# Patient Record
Sex: Female | Born: 1950 | Race: Black or African American | Hispanic: No | Marital: Single | Smoking: Never smoker
Health system: Southern US, Community
[De-identification: ages and names within clinical notes are randomized; demographics above are authoritative.]

## PROBLEM LIST (undated history)

## (undated) DIAGNOSIS — T7840XA Allergy, unspecified, initial encounter: Secondary | ICD-10-CM

## (undated) DIAGNOSIS — G4730 Sleep apnea, unspecified: Secondary | ICD-10-CM

## (undated) DIAGNOSIS — I10 Essential (primary) hypertension: Secondary | ICD-10-CM

## (undated) DIAGNOSIS — M1990 Unspecified osteoarthritis, unspecified site: Secondary | ICD-10-CM

## (undated) DIAGNOSIS — E079 Disorder of thyroid, unspecified: Secondary | ICD-10-CM

## (undated) HISTORY — DX: Unspecified osteoarthritis, unspecified site: M19.90

## (undated) HISTORY — DX: Sleep apnea, unspecified: G47.30

## (undated) HISTORY — DX: Essential (primary) hypertension: I10

## (undated) HISTORY — DX: Allergy, unspecified, initial encounter: T78.40XA

## (undated) HISTORY — PX: COLONOSCOPY: SHX174

## (undated) HISTORY — PX: ABDOMINAL HYSTERECTOMY: SHX81

## (undated) HISTORY — DX: Disorder of thyroid, unspecified: E07.9

## (undated) HISTORY — PX: THYROIDECTOMY: SHX17

---

## 2004-08-30 ENCOUNTER — Emergency Department (HOSPITAL_COMMUNITY): Admission: EM | Admit: 2004-08-30 | Discharge: 2004-08-30 | Payer: Self-pay | Admitting: Family Medicine

## 2005-08-15 ENCOUNTER — Ambulatory Visit: Payer: Self-pay | Admitting: Internal Medicine

## 2005-08-26 ENCOUNTER — Ambulatory Visit: Payer: Self-pay | Admitting: Internal Medicine

## 2006-04-28 ENCOUNTER — Encounter: Admission: RE | Admit: 2006-04-28 | Discharge: 2006-04-28 | Payer: Self-pay | Admitting: Internal Medicine

## 2008-11-28 ENCOUNTER — Ambulatory Visit: Admission: RE | Admit: 2008-11-28 | Discharge: 2008-11-28 | Payer: Self-pay | Admitting: Internal Medicine

## 2008-11-28 ENCOUNTER — Ambulatory Visit: Payer: Self-pay | Admitting: Vascular Surgery

## 2008-11-28 ENCOUNTER — Encounter (INDEPENDENT_AMBULATORY_CARE_PROVIDER_SITE_OTHER): Payer: Self-pay | Admitting: Internal Medicine

## 2009-09-04 ENCOUNTER — Encounter: Admission: RE | Admit: 2009-09-04 | Discharge: 2009-09-04 | Payer: Self-pay | Admitting: *Deleted

## 2010-07-08 ENCOUNTER — Encounter
Admission: RE | Admit: 2010-07-08 | Discharge: 2010-07-13 | Payer: Self-pay | Source: Home / Self Care | Attending: Internal Medicine | Admitting: Internal Medicine

## 2010-07-14 ENCOUNTER — Ambulatory Visit: Payer: BC Managed Care – PPO | Attending: Internal Medicine

## 2010-07-14 DIAGNOSIS — M25569 Pain in unspecified knee: Secondary | ICD-10-CM | POA: Insufficient documentation

## 2010-07-14 DIAGNOSIS — IMO0001 Reserved for inherently not codable concepts without codable children: Secondary | ICD-10-CM | POA: Insufficient documentation

## 2010-07-14 DIAGNOSIS — M542 Cervicalgia: Secondary | ICD-10-CM | POA: Insufficient documentation

## 2010-07-14 DIAGNOSIS — R293 Abnormal posture: Secondary | ICD-10-CM | POA: Insufficient documentation

## 2010-07-14 DIAGNOSIS — M2560 Stiffness of unspecified joint, not elsewhere classified: Secondary | ICD-10-CM | POA: Insufficient documentation

## 2010-07-16 ENCOUNTER — Ambulatory Visit: Payer: BC Managed Care – PPO

## 2010-07-20 ENCOUNTER — Ambulatory Visit: Payer: BC Managed Care – PPO

## 2010-07-22 ENCOUNTER — Ambulatory Visit: Payer: BC Managed Care – PPO

## 2010-10-22 ENCOUNTER — Other Ambulatory Visit: Payer: Self-pay | Admitting: Family Medicine

## 2010-10-22 DIAGNOSIS — Z1231 Encounter for screening mammogram for malignant neoplasm of breast: Secondary | ICD-10-CM

## 2010-10-29 ENCOUNTER — Ambulatory Visit
Admission: RE | Admit: 2010-10-29 | Discharge: 2010-10-29 | Disposition: A | Discharge status: Home / Self Care | Payer: BC Managed Care – PPO | Source: Ambulatory Visit | Attending: Family Medicine | Admitting: Family Medicine

## 2010-10-29 DIAGNOSIS — Z1231 Encounter for screening mammogram for malignant neoplasm of breast: Secondary | ICD-10-CM

## 2011-10-10 ENCOUNTER — Other Ambulatory Visit: Payer: Self-pay | Admitting: Family Medicine

## 2011-10-10 DIAGNOSIS — Z1231 Encounter for screening mammogram for malignant neoplasm of breast: Secondary | ICD-10-CM

## 2011-10-31 ENCOUNTER — Ambulatory Visit
Admission: RE | Admit: 2011-10-31 | Discharge: 2011-10-31 | Disposition: A | Discharge status: Home / Self Care | Payer: BC Managed Care – PPO | Source: Ambulatory Visit | Attending: Family Medicine | Admitting: Family Medicine

## 2011-10-31 DIAGNOSIS — Z1231 Encounter for screening mammogram for malignant neoplasm of breast: Secondary | ICD-10-CM

## 2012-01-20 ENCOUNTER — Other Ambulatory Visit: Payer: Self-pay | Admitting: Family Medicine

## 2012-01-20 DIAGNOSIS — E042 Nontoxic multinodular goiter: Secondary | ICD-10-CM

## 2012-01-30 ENCOUNTER — Other Ambulatory Visit: Payer: BC Managed Care – PPO

## 2012-02-07 ENCOUNTER — Ambulatory Visit
Admission: RE | Admit: 2012-02-07 | Discharge: 2012-02-07 | Disposition: A | Discharge status: Home / Self Care | Payer: BC Managed Care – PPO | Source: Ambulatory Visit | Attending: Family Medicine | Admitting: Family Medicine

## 2012-02-07 DIAGNOSIS — E042 Nontoxic multinodular goiter: Secondary | ICD-10-CM

## 2012-02-14 ENCOUNTER — Other Ambulatory Visit: Payer: Self-pay | Admitting: Otolaryngology

## 2012-02-14 DIAGNOSIS — E041 Nontoxic single thyroid nodule: Secondary | ICD-10-CM

## 2012-02-15 ENCOUNTER — Ambulatory Visit
Admission: RE | Admit: 2012-02-15 | Discharge: 2012-02-15 | Disposition: A | Discharge status: Home / Self Care | Payer: BC Managed Care – PPO | Source: Ambulatory Visit | Attending: Otolaryngology | Admitting: Otolaryngology

## 2012-02-15 ENCOUNTER — Other Ambulatory Visit (HOSPITAL_COMMUNITY)
Admission: RE | Admit: 2012-02-15 | Discharge: 2012-02-15 | Disposition: A | Discharge status: Home / Self Care | Payer: BC Managed Care – PPO | Source: Ambulatory Visit | Attending: Interventional Radiology | Admitting: Interventional Radiology

## 2012-02-15 DIAGNOSIS — E041 Nontoxic single thyroid nodule: Secondary | ICD-10-CM

## 2012-02-15 DIAGNOSIS — E049 Nontoxic goiter, unspecified: Secondary | ICD-10-CM | POA: Insufficient documentation

## 2012-10-22 ENCOUNTER — Other Ambulatory Visit: Payer: Self-pay | Admitting: Family Medicine

## 2012-10-22 DIAGNOSIS — Z1231 Encounter for screening mammogram for malignant neoplasm of breast: Secondary | ICD-10-CM

## 2012-10-22 DIAGNOSIS — E2839 Other primary ovarian failure: Secondary | ICD-10-CM

## 2012-11-29 ENCOUNTER — Ambulatory Visit
Admission: RE | Admit: 2012-11-29 | Discharge: 2012-11-29 | Disposition: A | Discharge status: Home / Self Care | Payer: BC Managed Care – PPO | Source: Ambulatory Visit | Attending: Family Medicine | Admitting: Family Medicine

## 2012-11-29 DIAGNOSIS — Z1231 Encounter for screening mammogram for malignant neoplasm of breast: Secondary | ICD-10-CM

## 2012-11-29 DIAGNOSIS — E2839 Other primary ovarian failure: Secondary | ICD-10-CM

## 2013-03-08 ENCOUNTER — Other Ambulatory Visit: Payer: Self-pay | Admitting: Otolaryngology

## 2013-03-08 DIAGNOSIS — D497 Neoplasm of unspecified behavior of endocrine glands and other parts of nervous system: Secondary | ICD-10-CM

## 2013-03-14 ENCOUNTER — Ambulatory Visit
Admission: RE | Admit: 2013-03-14 | Discharge: 2013-03-14 | Disposition: A | Discharge status: Home / Self Care | Payer: BC Managed Care – PPO | Source: Ambulatory Visit | Attending: Otolaryngology | Admitting: Otolaryngology

## 2013-03-14 DIAGNOSIS — D497 Neoplasm of unspecified behavior of endocrine glands and other parts of nervous system: Secondary | ICD-10-CM

## 2013-11-08 ENCOUNTER — Other Ambulatory Visit: Payer: Self-pay

## 2013-11-08 DIAGNOSIS — Z1231 Encounter for screening mammogram for malignant neoplasm of breast: Secondary | ICD-10-CM

## 2013-12-05 ENCOUNTER — Ambulatory Visit
Admission: RE | Admit: 2013-12-05 | Discharge: 2013-12-05 | Disposition: A | Discharge status: Home / Self Care | Payer: BC Managed Care – PPO | Source: Ambulatory Visit

## 2013-12-05 ENCOUNTER — Encounter (INDEPENDENT_AMBULATORY_CARE_PROVIDER_SITE_OTHER): Payer: Self-pay

## 2013-12-05 DIAGNOSIS — Z1231 Encounter for screening mammogram for malignant neoplasm of breast: Secondary | ICD-10-CM

## 2014-02-14 ENCOUNTER — Other Ambulatory Visit: Payer: Self-pay | Admitting: Otolaryngology

## 2014-02-14 ENCOUNTER — Ambulatory Visit
Admission: RE | Admit: 2014-02-14 | Discharge: 2014-02-14 | Disposition: A | Discharge status: Home / Self Care | Payer: BC Managed Care – PPO | Source: Ambulatory Visit | Attending: Otolaryngology | Admitting: Otolaryngology

## 2014-02-14 DIAGNOSIS — D497 Neoplasm of unspecified behavior of endocrine glands and other parts of nervous system: Secondary | ICD-10-CM

## 2014-12-02 ENCOUNTER — Other Ambulatory Visit: Payer: Self-pay

## 2014-12-02 DIAGNOSIS — Z1231 Encounter for screening mammogram for malignant neoplasm of breast: Secondary | ICD-10-CM

## 2014-12-10 ENCOUNTER — Ambulatory Visit
Admission: RE | Admit: 2014-12-10 | Discharge: 2014-12-10 | Disposition: A | Discharge status: Home / Self Care | Payer: BLUE CROSS/BLUE SHIELD | Source: Ambulatory Visit

## 2014-12-10 DIAGNOSIS — Z1231 Encounter for screening mammogram for malignant neoplasm of breast: Secondary | ICD-10-CM

## 2015-08-28 ENCOUNTER — Encounter: Payer: Self-pay | Admitting: Gastroenterology

## 2015-09-14 ENCOUNTER — Encounter: Payer: Self-pay | Admitting: Gastroenterology

## 2015-11-03 ENCOUNTER — Ambulatory Visit (AMBULATORY_SURGERY_CENTER): Payer: Self-pay

## 2015-11-03 VITALS — Ht 64.0 in | Wt 220.4 lb

## 2015-11-03 DIAGNOSIS — Z1211 Encounter for screening for malignant neoplasm of colon: Secondary | ICD-10-CM

## 2015-11-03 MED ORDER — NA SULFATE-K SULFATE-MG SULF 17.5-3.13-1.6 GM/177ML PO SOLN: ORAL | Status: DC

## 2015-11-03 NOTE — Progress Notes (Signed)
Per pt, no allergies to soy or egg products.Pt not taking any weight loss meds or using  O2 at home. 

## 2015-11-10 ENCOUNTER — Encounter: Payer: Self-pay | Admitting: Gastroenterology

## 2015-11-13 ENCOUNTER — Telehealth: Payer: Self-pay | Admitting: Gastroenterology

## 2015-11-13 NOTE — Telephone Encounter (Signed)
Called patient back, will leave Moviprep for patient and go over new instructions when she gets here

## 2015-11-17 ENCOUNTER — Other Ambulatory Visit: Payer: Self-pay | Admitting: Family Medicine

## 2015-11-17 ENCOUNTER — Ambulatory Visit (AMBULATORY_SURGERY_CENTER): Payer: BLUE CROSS/BLUE SHIELD | Admitting: Gastroenterology

## 2015-11-17 ENCOUNTER — Encounter: Payer: Self-pay | Admitting: Gastroenterology

## 2015-11-17 VITALS — BP 132/77 | HR 82 | Temp 97.7°F | Resp 13 | Ht 64.0 in | Wt 220.0 lb

## 2015-11-17 DIAGNOSIS — Z1211 Encounter for screening for malignant neoplasm of colon: Secondary | ICD-10-CM | POA: Diagnosis not present

## 2015-11-17 DIAGNOSIS — Z1231 Encounter for screening mammogram for malignant neoplasm of breast: Secondary | ICD-10-CM

## 2015-11-17 MED ORDER — SODIUM CHLORIDE 0.9 % IV SOLN: 500.0000 mL | INTRAVENOUS | Status: DC

## 2015-11-17 NOTE — Progress Notes (Signed)
A and O x3. Report to RN. Tolerated MAC anesthesia well. 

## 2015-11-17 NOTE — Patient Instructions (Signed)
YOU HAD AN ENDOSCOPIC PROCEDURE TODAY AT [facility]:   Refer to the procedure report that was given to you for any specific questions about what was found during the examination.  If the procedure report does not answer your questions, please call your gastroenterologist to clarify.  If you requested that your care partner not be given the details of your procedure findings, then the procedure report has been included in a sealed envelope for you to review at your convenience later.  YOU SHOULD EXPECT: Some feelings of bloating in the abdomen. Passage of more gas than usual.  Walking can help get rid of the air that was put into your GI tract during the procedure and reduce the bloating. If you had a lower endoscopy (such as a colonoscopy or flexible sigmoidoscopy) you may notice spotting of blood in your stool or on the toilet paper. If you underwent a bowel prep for your procedure, you may not have a normal bowel movement for a few days.  Please Note:  You might notice some irritation and congestion in your nose or some drainage.  This is from the oxygen used during your procedure.  There is no need for concern and it should clear up in a day or so.  SYMPTOMS TO REPORT IMMEDIATELY:   Following lower endoscopy (colonoscopy or flexible sigmoidoscopy):  Excessive amounts of blood in the stool  Significant tenderness or worsening of abdominal pains  Swelling of the abdomen that is new, acute  Fever of 100F or higher   For urgent or emergent issues, a gastroenterologist can be reached at any hour by calling [phone].   DIET: Your first meal following the procedure should be a small meal and then it is ok to progress to your normal diet. Heavy or fried foods are harder to digest and may make you feel nauseous or bloated.  Likewise, meals heavy in dairy and vegetables can increase bloating.  Drink plenty of fluids but you should avoid alcoholic beverages for 24  hours.  ACTIVITY:  You should plan to take it easy for the rest of today and you should NOT DRIVE or use heavy machinery until tomorrow (because of the sedation medicines used during the test).    FOLLOW UP: Our staff will call the number listed on your records the next business day following your procedure to check on you and address any questions or concerns that you may have regarding the information given to you following your procedure. If we do not reach you, we will leave a message.  However, if you are feeling well and you are not experiencing any problems, there is no need to return our call.  We will assume that you have returned to your regular daily activities without incident.  If any biopsies were taken you will be contacted by phone or by letter within the next 1-3 weeks.  Please call us at [phone] if you have not heard about the biopsies in 3 weeks.    SIGNATURES/CONFIDENTIALITY: You and/or your care partner have signed paperwork which will be entered into your electronic medical record.  These signatures attest to the fact that that the information above on your After Visit Summary has been reviewed and is understood.  Full responsibility of the confidentiality of this discharge information lies with you and/or your care-partner.  Diverticulosis, high fiber diet and hemorrhoid information given.  Recall colonoscopy 10 years 2027.

## 2015-11-17 NOTE — Op Note (Signed)
[facility] Patient Name: Jennifer Shields Procedure Date: 11/17/2015 9:53 AM MRN: [id number] Endoscopist: Mauri Pole , MD Age: 65 Referring MD:  Date of Birth: Aug 23, 1950 Gender: Female Procedure:                Colonoscopy Indications:              Screening for colorectal malignant neoplasm Medicines:                Monitored Anesthesia Care Procedure:                Pre-Anesthesia Assessment:                           - Prior to the procedure, a History and Physical                            was performed, and patient medications and                            allergies were reviewed. The patient's tolerance of                            previous anesthesia was also reviewed. The risks                            and benefits of the procedure and the sedation                            options and risks were discussed with the patient.                            All questions were answered, and informed consent                            was obtained. Prior Anticoagulants: The patient has                            taken no previous anticoagulant or antiplatelet                            agents. ASA Grade Assessment: II - A patient with                            mild systemic disease. After reviewing the risks                            and benefits, the patient was deemed in                            satisfactory condition to undergo the procedure.                           After obtaining informed consent, the colonoscope  was passed under direct vision. Throughout the                            procedure, the patient's blood pressure, pulse, and                            oxygen saturations were monitored continuously. The                            Model CF-HQ190L [id number]) scope was introduced                            through the anus and advanced to the the cecum,                            identified by appendiceal orifice  and ileocecal                            valve. The colonoscopy was performed without                            difficulty. The patient tolerated the procedure                            well. The quality of the bowel preparation was                            good. The ileocecal valve, appendiceal orifice, and                            rectum were photographed. Scope In: 10:13:25 AM Scope Out: 10:29:12 AM Scope Withdrawal Time: 0 hours 11 minutes 57 seconds  Total Procedure Duration: 0 hours 15 minutes 47 seconds  Findings:                 The perianal and digital rectal examinations were                            normal.                           Multiple small-mouthed diverticula were found in                            the sigmoid colon and descending colon.                           Non-bleeding internal hemorrhoids were found during                            retroflexion. The hemorrhoids were small. Complications:            No immediate complications. Estimated Blood Loss:     Estimated blood loss: none. Impression:               - Diverticulosis in the sigmoid colon and in  the                            descending colon.                           - Non-bleeding internal hemorrhoids.                           - No specimens collected. Recommendation:           - Patient has a contact number available for                            emergencies. The signs and symptoms of potential                            delayed complications were discussed with the                            patient. Return to normal activities tomorrow.                            Written discharge instructions were provided to the                            patient.                           - Resume previous diet.                           - Continue present medications.                           - Repeat colonoscopy in 10 years for screening                            purposes.                            - Return to GI clinic (date not yet determined). Mauri Pole, MD 11/17/2015 10:33:42 AM This report has been signed electronically.

## 2015-11-18 ENCOUNTER — Telehealth: Payer: Self-pay

## 2015-11-18 NOTE — Telephone Encounter (Signed)
  Follow up Call-  Call back number 11/17/2015  Post procedure Call Back phone  # [phone]  Permission to leave phone message Yes     Patient questions:  Do you have a fever, pain , or abdominal swelling? No. Pain Score  0 *  Have you tolerated food without any problems? Yes.    Have you been able to return to your normal activities? Yes.    Do you have any questions about your discharge instructions: Diet   No. Medications  No. Follow up visit  No.  Do you have questions or concerns about your Care? No.  Actions: * If pain score is 4 or above: No action needed, pain <4.

## 2015-12-11 ENCOUNTER — Ambulatory Visit
Admission: RE | Admit: 2015-12-11 | Discharge: 2015-12-11 | Disposition: A | Discharge status: Home / Self Care | Payer: BLUE CROSS/BLUE SHIELD | Source: Ambulatory Visit | Attending: Family Medicine | Admitting: Family Medicine

## 2015-12-11 DIAGNOSIS — Z1231 Encounter for screening mammogram for malignant neoplasm of breast: Secondary | ICD-10-CM

## 2016-03-03 ENCOUNTER — Emergency Department (HOSPITAL_COMMUNITY)
Admission: EM | Admit: 2016-03-03 | Discharge: 2016-03-03 | Disposition: A | Discharge status: Home / Self Care | Payer: BLUE CROSS/BLUE SHIELD | Attending: Emergency Medicine | Admitting: Emergency Medicine

## 2016-03-03 ENCOUNTER — Encounter (HOSPITAL_COMMUNITY): Payer: Self-pay | Admitting: Emergency Medicine

## 2016-03-03 DIAGNOSIS — Y929 Unspecified place or not applicable: Secondary | ICD-10-CM | POA: Diagnosis not present

## 2016-03-03 DIAGNOSIS — S51852A Open bite of left forearm, initial encounter: Secondary | ICD-10-CM | POA: Insufficient documentation

## 2016-03-03 DIAGNOSIS — W540XXA Bitten by dog, initial encounter: Secondary | ICD-10-CM | POA: Diagnosis not present

## 2016-03-03 DIAGNOSIS — Y999 Unspecified external cause status: Secondary | ICD-10-CM | POA: Diagnosis not present

## 2016-03-03 DIAGNOSIS — I10 Essential (primary) hypertension: Secondary | ICD-10-CM | POA: Diagnosis not present

## 2016-03-03 DIAGNOSIS — Y9389 Activity, other specified: Secondary | ICD-10-CM | POA: Diagnosis not present

## 2016-03-03 DIAGNOSIS — Z23 Encounter for immunization: Secondary | ICD-10-CM | POA: Diagnosis not present

## 2016-03-03 DIAGNOSIS — S41152A Open bite of left upper arm, initial encounter: Secondary | ICD-10-CM

## 2016-03-03 MED ORDER — AMOXICILLIN-POT CLAVULANATE 875-125 MG PO TABS: 1.0000 | ORAL_TABLET | Freq: Two times a day (BID) | ORAL | 0 refills | Status: DC

## 2016-03-03 MED ORDER — TETANUS-DIPHTH-ACELL PERTUSSIS 5-2.5-18.5 LF-MCG/0.5 IM SUSP
0.5000 mL | Freq: Once | INTRAMUSCULAR | Status: AC
  Administered 2016-03-03: 0.5 mL via INTRAMUSCULAR
  Filled 2016-03-03: qty 0.5

## 2016-03-03 NOTE — ED Provider Notes (Signed)
[facility] DEPT Provider Note   CSN: [id number] Arrival date & time: 03/03/16  2221  By signing my name below, I, Jennifer Shields, attest that this documentation has been prepared under the direction and in the presence of  Sanjiv Castorena Camprubi-Soms, PA-C. Electronically Signed: Delton Shields, ED Scribe. 03/03/16. 10:48 PM.   History   Chief Complaint Chief Complaint  Patient presents with  . Animal Bite    The history is provided by the patient. No language interpreter was used.  Animal Bite  Contact animal:  Dog Location:  Shoulder/arm Shoulder/arm injury location:  L forearm Time since incident:  45 minutes Pain details:    Severity:  No pain Incident location:  Home Provoked: provoked   Notifications:  None (will attempt to call animal control tonight, if not tomorrow morning) Animal's rabies vaccination status:  Unknown (believes they've been vaccinated in the past, but unsure if UTD) Animal in possession: yes   Tetanus status:  Unknown Relieved by:  None tried Worsened by:  Nothing Ineffective treatments:  None tried Associated symptoms: no numbness and no swelling    HPI Comments:  Jennifer Shields is a 65 y.o. female who presents to the Emergency Department complaining of provoked dog bite to her left forearm sustained at 10PM today ~78mins PTA. Pt notes the dogs are hers, still in her possession, but isn't sure if their vaccinations are UTD but thinks they got their rabies vaccines at some point when they were puppies. She states her dogs were fighting with each other and she tried to break it up when she was bitten. Pt denies associated pain or ongoing bleeding. She also denies being on blood thinners or having medical conditions causing easy bleeding. Denies bruising, swelling, numbness, tingling, or weakness. Pt cannot remember when her last tetanus was. No aggravating/alleviating factors noted. She has not yet called animal control because she thought they were closed,  but intends to call them tomorrow morning. Requesting assistance with this process. No other complaints at this time.   Past Medical History:  Diagnosis Date  . Allergy   . Arthritis    in hands,knees ,feet and legs  . Hypertension   . Sleep apnea    does not uses c-pap at this time  . Thyroid disease     There are no active problems to display for this patient.   Past Surgical History:  Procedure Laterality Date  . ABDOMINAL HYSTERECTOMY    . COLONOSCOPY    . THYROIDECTOMY      OB History    No data available       Home Medications    Prior to Admission medications   Medication Sig Start Date End Date Taking? Authorizing Provider  carvedilol (COREG) 25 MG tablet Take 25 mg by mouth 2 (two) times daily with a meal.    Historical Provider, MD  Cholecalciferol (VITAMIN D3) 2000 units TABS Take by mouth daily.    Historical Provider, MD  cloNIDine (CATAPRES) 0.1 MG tablet Take 0.1 mg by mouth 2 (two) times daily.    Historical Provider, MD  diltiazem (CARDIZEM CD) 180 MG 24 hr capsule TAKE 1 CAPSULE ONCE A DAY ORALLY 30 10/17/15   Historical Provider, MD  levothyroxine (SYNTHROID, LEVOTHROID) 50 MCG tablet 1 TABLET EVERY MORNING ON AN EMPTY STOMACH ONCE A DAY ORALLY 30 DAYS 10/07/15   Historical Provider, MD  loratadine (CLARITIN) 10 MG tablet Take 10 mg by mouth daily.    Historical Provider, MD  meloxicam (MOBIC) 7.5  MG tablet Take 7.5 mg by mouth daily.    Historical Provider, MD  Omega-3 Fatty Acids (FISH OIL) 1000 MG CAPS Take by mouth daily.    Historical Provider, MD  OVER THE COUNTER MEDICATION Nature Code Veggie capsule-Take one daily    Historical Provider, MD  valsartan-hydrochlorothiazide (DIOVAN-HCT) 320-25 MG tablet Take 1 tablet by mouth daily.    Historical Provider, MD    Family History History reviewed. No pertinent family history.  Social History Social History  Substance Use Topics  . Smoking status: Never Smoker  . Smokeless tobacco: Never Used  .  Alcohol use No     Allergies   Review of patient's allergies indicates no known allergies.   Review of Systems Review of Systems  Musculoskeletal: Negative for arthralgias and myalgias.  Skin: Positive for wound. Negative for color change.  Allergic/Immunologic: Negative for immunocompromised state.  Neurological: Negative for weakness and numbness.  Hematological: Does not bruise/bleed easily.  Psychiatric/Behavioral: Negative for confusion.     Physical Exam Updated Vital Signs BP 150/96 (BP Location: Right Arm)   Pulse 102   Temp 98.3 F (36.8 C) (Oral)   Resp 18   Ht 5\' 4"  (1.626 m)   Wt 217 lb (98.4 kg)   SpO2 100%   BMI 37.25 kg/m   Physical Exam  Constitutional: She is oriented to person, place, and time. Vital signs are normal. She appears well-developed and well-nourished.  Non-toxic appearance. No distress.  Afebrile, nontoxic, NAD  HENT:  Head: Normocephalic and atraumatic.  Mouth/Throat: Mucous membranes are normal.  Eyes: Conjunctivae and EOM are normal. Right eye exhibits no discharge. Left eye exhibits no discharge.  Neck: Normal range of motion. Neck supple.  Cardiovascular: Normal rate and intact distal pulses.   Pulmonary/Chest: Effort normal. No respiratory distress.  Abdominal: Normal appearance. She exhibits no distension.  Musculoskeletal: Normal range of motion.       Left forearm: She exhibits laceration. She exhibits no tenderness, no bony tenderness, no swelling and no deformity.  L forearm with FROM intact in all joints, no focal bony TTP, with 2 puncture wounds (one to each side of her forearm), and a very small additional puncture wound next to one of the puncture wounds along the radial aspect of the forearm, no ongoing bleeding, no retained FB's, no swelling or bruising, no deformity or crepitus, strength and sensation grossly intact, distal pulses intact, soft compartments. SEE PICTURES BELOW.   Neurological: She is alert and oriented to  person, place, and time. She has normal strength. No sensory deficit.  Skin: Skin is warm and dry. Laceration noted. No rash noted.  L forearm bite marks as mentioned above and pictured below.   Psychiatric: She has a normal mood and affect. Her behavior is normal.  Nursing note and vitals reviewed.        ED Treatments / Results  DIAGNOSTIC STUDIES:  Oxygen Saturation is 100% on RA, normal by my interpretation.    COORDINATION OF CARE:  10:41 PM Discussed treatment plan with pt at bedside and pt agreed to plan.  Labs (all labs ordered are listed, but only abnormal results are displayed) Labs Reviewed - No data to display  EKG  EKG Interpretation None       Radiology No results found.  Procedures Procedures (including critical care time)  Medications Ordered in ED Medications  Tdap (BOOSTRIX) injection 0.5 mL (not administered)     Initial Impression / Assessment and Plan / ED Course  I  have reviewed the triage vital signs and the nursing notes.  Pertinent labs & imaging results that were available during my care of the patient were reviewed by me and considered in my medical decision making (see chart for details).  Clinical Course    65 y.o. female here with dog bite of L forearm, NVI with soft compartments, 2 small puncture wounds to forearm and one additional tiny wound next to one of the puncture wounds, bleeding controlled. Irrigated well with saline. No focal bony TTP, doubt need for xrays. Dogs are in her possession, she's unsure if they're UTD on rabies, but will contact animal control and have them quarantined (nursing staff to help with this), thus no rabies vaccines started today. Updated tetanus. Will rx augmentin, discussed wound care, tylenol/motrin/RICE for pain control, and f/up with PCP in 2-3 days for recheck of wounds and ongoing management. Stressed importance of f/up with animal control regarding her animals' quarantining. I explained the  diagnosis and have given explicit precautions to return to the ER including for any other new or worsening symptoms. The patient understands and accepts the medical plan as it's been dictated and I have answered their questions. Discharge instructions concerning home care and prescriptions have been given. The patient is STABLE and is discharged to home in good condition.    Final Clinical Impressions(s) / ED Diagnoses   Final diagnoses:  Dog bite of left arm, initial encounter    New Prescriptions New Prescriptions   AMOXICILLIN-CLAVULANATE (AUGMENTIN) 875-125 MG TABLET    Take 1 tablet by mouth 2 (two) times daily. One po bid x 7 days   I personally performed the services described in this documentation, which was scribed in my presence. The recorded information has been reviewed and is accurate.     Miel Wisener Camprubi-Soms, PA-C 03/03/16 [place], MD 03/03/16 [phone]

## 2016-03-03 NOTE — ED Triage Notes (Signed)
Pt here with 3 small puncture wounds to left forearm. Pt sts she was trying to pull her dogs apart when they were fighting and accidentally got bit.

## 2016-03-03 NOTE — Discharge Instructions (Signed)
Keep wounds clean with mild soap and water. Keep area covered with a topical antibiotic ointment and bandage, keep bandage dry, and take antibiotic as directed and until completed. Ice and elevate for additional pain relief and swelling. Alternate between Ibuprofen and Tylenol for additional pain relief. It's important that you contact animal control in order for them to quarantine your dogs to watch for signs/symptoms of rabies-- if they develop symptoms then you must get the rabies vaccines, but if they don't develop symptoms then you would not be required to get the vaccines. Follow up with your primary care doctor or the [facility] in approximately 2-3 days for wound recheck and ongoing management of symptoms. Monitor area for signs of infection to include, but not limited to: increasing pain, spreading redness, drainage/pus, worsening swelling, or fevers. Return to emergency department for emergent changing or worsening symptoms.

## 2016-03-24 ENCOUNTER — Other Ambulatory Visit: Payer: Self-pay | Admitting: Family Medicine

## 2016-03-24 DIAGNOSIS — E010 Iodine-deficiency related diffuse (endemic) goiter: Secondary | ICD-10-CM

## 2016-04-05 ENCOUNTER — Other Ambulatory Visit: Payer: BLUE CROSS/BLUE SHIELD

## 2016-04-27 ENCOUNTER — Other Ambulatory Visit: Payer: BLUE CROSS/BLUE SHIELD

## 2016-04-29 ENCOUNTER — Ambulatory Visit
Admission: RE | Admit: 2016-04-29 | Discharge: 2016-04-29 | Disposition: A | Discharge status: Home / Self Care | Payer: BLUE CROSS/BLUE SHIELD | Source: Ambulatory Visit | Attending: Family Medicine | Admitting: Family Medicine

## 2016-04-29 DIAGNOSIS — E010 Iodine-deficiency related diffuse (endemic) goiter: Secondary | ICD-10-CM

## 2016-05-31 ENCOUNTER — Encounter (HOSPITAL_COMMUNITY): Payer: Self-pay | Admitting: Emergency Medicine

## 2016-05-31 ENCOUNTER — Emergency Department (HOSPITAL_COMMUNITY): Payer: BLUE CROSS/BLUE SHIELD

## 2016-05-31 ENCOUNTER — Emergency Department (HOSPITAL_COMMUNITY)
Admission: EM | Admit: 2016-05-31 | Discharge: 2016-05-31 | Disposition: A | Discharge status: Home / Self Care | Payer: BLUE CROSS/BLUE SHIELD | Attending: Emergency Medicine | Admitting: Emergency Medicine

## 2016-05-31 DIAGNOSIS — I10 Essential (primary) hypertension: Secondary | ICD-10-CM | POA: Diagnosis not present

## 2016-05-31 DIAGNOSIS — Z79899 Other long term (current) drug therapy: Secondary | ICD-10-CM | POA: Diagnosis not present

## 2016-05-31 DIAGNOSIS — M79601 Pain in right arm: Secondary | ICD-10-CM

## 2016-05-31 DIAGNOSIS — Z7982 Long term (current) use of aspirin: Secondary | ICD-10-CM | POA: Insufficient documentation

## 2016-05-31 DIAGNOSIS — M79603 Pain in arm, unspecified: Secondary | ICD-10-CM

## 2016-05-31 LAB — BASIC METABOLIC PANEL
ANION GAP: 7 (ref 5–15)
BUN: 8 mg/dL (ref 6–20)
CHLORIDE: 101 mmol/L (ref 101–111)
CO2: 32 mmol/L (ref 22–32)
Calcium: 9.7 mg/dL (ref 8.9–10.3)
Creatinine, Ser: 0.8 mg/dL (ref 0.44–1.00)
Glucose, Bld: 111 mg/dL — ABNORMAL HIGH (ref 65–99)
POTASSIUM: 3.2 mmol/L — AB (ref 3.5–5.1)
SODIUM: 140 mmol/L (ref 135–145)

## 2016-05-31 LAB — CBC
HEMATOCRIT: 38.5 % (ref 36.0–46.0)
HEMOGLOBIN: 12.6 g/dL (ref 12.0–15.0)
MCH: 27.9 pg (ref 26.0–34.0)
MCHC: 32.7 g/dL (ref 30.0–36.0)
MCV: 85.4 fL (ref 78.0–100.0)
Platelets: 297 10*3/uL (ref 150–400)
RBC: 4.51 MIL/uL (ref 3.87–5.11)
RDW: 13.9 % (ref 11.5–15.5)
WBC: 6.3 10*3/uL (ref 4.0–10.5)

## 2016-05-31 LAB — I-STAT TROPONIN, ED: Troponin i, poc: 0 ng/mL (ref 0.00–0.08)

## 2016-05-31 LAB — TROPONIN I

## 2016-05-31 NOTE — ED Triage Notes (Signed)
Pt states she is been having 8/10 left arm pain, denies any injury.

## 2016-05-31 NOTE — ED Provider Notes (Signed)
[facility] DEPT Provider Note   CSN: [id number] Arrival date & time: 05/31/16  0501     History   Chief Complaint Chief Complaint  Patient presents with  . Arm Pain    HPI Jennifer Shields is a 65 y.o. female.  The history is provided by the patient.  Arm Pain  This is a new problem. Episode onset: several hrs ago. The problem occurs constantly. The problem has been resolved. Pertinent negatives include no chest pain and no shortness of breath. Nothing aggravates the symptoms. Nothing relieves the symptoms.   Patient presents with right arm pain She reports she woke up with right arm pain No cp/sob/weakness/nausea/diaphoresis She reports at times she has had left arm pain as well No CP reported No h/o CAD She is now pain free Past Medical History:  Diagnosis Date  . Allergy   . Arthritis    in hands,knees ,feet and legs  . Hypertension   . Sleep apnea    does not uses c-pap at this time  . Thyroid disease     There are no active problems to display for this patient.   Past Surgical History:  Procedure Laterality Date  . ABDOMINAL HYSTERECTOMY    . COLONOSCOPY    . THYROIDECTOMY      OB History    No data available       Home Medications    Prior to Admission medications   Medication Sig Start Date End Date Taking? Authorizing Provider  aspirin EC 81 MG tablet Take 81 mg by mouth daily.   Yes Historical Provider, MD  carvedilol (COREG) 25 MG tablet Take 25 mg by mouth 2 (two) times daily with a meal.   Yes Historical Provider, MD  Cholecalciferol (VITAMIN D3) 2000 units TABS Take 2,000 Units by mouth daily.    Yes Historical Provider, MD  diltiazem (CARDIZEM CD) 180 MG 24 hr capsule Take 180 mg by mouth once a day 10/17/15  Yes Historical Provider, MD  esomeprazole (NEXIUM) 20 MG capsule Take 20 mg by mouth daily. 05/04/16  Yes Historical Provider, MD  levothyroxine (SYNTHROID, LEVOTHROID) 50 MCG tablet Take 50 mcg by mouth in the morning before  breakfast 10/07/15  Yes Historical Provider, MD  meloxicam (MOBIC) 7.5 MG tablet Take 7.5 mg by mouth daily.   Yes Historical Provider, MD  valsartan-hydrochlorothiazide (DIOVAN-HCT) 320-25 MG tablet Take 1 tablet by mouth daily.   Yes Historical Provider, MD  amoxicillin-clavulanate (AUGMENTIN) 875-125 MG tablet Take 1 tablet by mouth 2 (two) times daily. One po bid x 7 days Patient not taking: Reported on 05/31/2016 03/03/16   Mercedes Camprubi-Soms, PA-C  cloNIDine (CATAPRES) 0.1 MG tablet Take 0.1 mg by mouth 2 (two) times daily.    Historical Provider, MD    Family History History reviewed. No pertinent family history.  Social History Social History  Substance Use Topics  . Smoking status: Never Smoker  . Smokeless tobacco: Never Used  . Alcohol use No     Allergies   Patient has no known allergies.   Review of Systems Review of Systems  Constitutional: Negative for diaphoresis and fever.  Respiratory: Negative for shortness of breath.   Cardiovascular: Negative for chest pain.  Gastrointestinal: Negative for nausea.  Musculoskeletal: Positive for arthralgias and myalgias.  All other systems reviewed and are negative.    Physical Exam Updated Vital Signs BP 132/78   Pulse 80   Temp 97.9 F (36.6 C) (Oral)   Resp 13  Ht 5\' 4"  (1.626 m)   Wt 98.4 kg   SpO2 100%   BMI 37.25 kg/m   Physical Exam CONSTITUTIONAL: Well developed/well nourished HEAD: Normocephalic/atraumatic EYES: EOMI/PERRL ENMT: Mucous membranes moist NECK: supple no meningeal signs SPINE/BACK:entire spine nontender CV: S1/S2 noted, no murmurs/rubs/gallops noted LUNGS: Lungs are clear to auscultation bilaterally, no apparent distress ABDOMEN: soft, nontender, no rebound or guarding, bowel sounds noted throughout abdomen GU:no cva tenderness NEURO: Pt is awake/alert/appropriate, moves all extremitiesx4.  No facial droop.   EXTREMITIES: pulses normal/equalx4, full ROM, no  tenderness/edema/erythema to upper extremities. SKIN: warm, color normal PSYCH: no abnormalities of mood noted, alert and oriented to situation   ED Treatments / Results  Labs (all labs ordered are listed, but only abnormal results are displayed) Labs Reviewed  BASIC METABOLIC PANEL - Abnormal; Notable for the following:       Result Value   Potassium 3.2 (*)    Glucose, Bld 111 (*)    All other components within normal limits  CBC  I-STAT TROPOININ, ED    EKG  EKG Interpretation  Date/Time:  Tuesday May 31 2016 05:33:04 EST Ventricular Rate:  84 PR Interval:    QRS Duration: 90 QT Interval:  393 QTC Calculation: 465 R Axis:   6 Text Interpretation:  Sinus rhythm Abnormal R-wave progression, early transition Borderline T abnormalities, diffuse leads No previous ECGs available Confirmed by Christy Gentles  MD, Amyriah Buras ([id number]) on 05/31/2016 5:35:05 AM       Radiology Dg Chest Portable 1 View  Result Date: 05/31/2016 CLINICAL DATA:  65 year old female with chest pain. EXAM: PORTABLE CHEST 1 VIEW COMPARISON:  None. FINDINGS: The lungs are clear. There is no pleural effusion or pneumothorax. The cardiac silhouette is within normal limits. There is degenerative changes of the spine and shoulders. Elevation of the humeral head compatible with chronic rotator cuff injury. No acute fracture. IMPRESSION: No acute cardiopulmonary process. Electronically Signed   By: Anner Crete M.D.   On: 05/31/2016 05:53    Procedures Procedures (including critical care time)  Medications Ordered in ED Medications - No data to display   Initial Impression / Assessment and Plan / ED Course  I have reviewed the triage vital signs and the nursing notes.  Pertinent labs & imaging results that were available during my care of the patient were reviewed by me and considered in my medical decision making (see chart for details).  Clinical Course     6:56 AM Pt stable She woke up with right  arm pain, but also had recent left arm pain Denies CP Her initial EKG was worrisome for STEMI, however, it had artifact and repeat EKG at 0533 did not reveal STEMI Current HEART score - 3 Will obtain 2nd troponin At signout to dr Dayna Barker, f/u on repeat troponin and if negative d/c home   Otherwise low suspicion for acute PE/Dissection at this time  Final Clinical Impressions(s) / ED Diagnoses   Final diagnoses:  None    New Prescriptions New Prescriptions   No medications on file     Ripley Fraise, MD 05/31/16 [phone]

## 2016-05-31 NOTE — ED Provider Notes (Signed)
9:06 AM Assumed care from Dr. Christy Gentles, please see their note for full history, physical and decision making until this point. In brief this is a 65 y.o. year old female who presented to the ED tonight with Arm Pain     With some arm pain. Left, maybe right. Initial ECG w/ artifact concerning for STEMI but no reciprocal changes. mulitple repeats not concerning. HEART of 3, plan for repeat troponin and ECG at 0820.   Asymptomatic during my care. Repeat troponin and ecg 6 hours after onset of symptoms still unremarkable. Plan for dc with pcp follow up.   Discharge instructions, including strict return precautions for new or worsening symptoms, given. Patient and/or family verbalized understanding and agreement with the plan as described.   Labs, studies and imaging reviewed by myself and considered in medical decision making if ordered. Imaging interpreted by radiology.  Labs Reviewed  BASIC METABOLIC PANEL - Abnormal; Notable for the following:       Result Value   Potassium 3.2 (*)    Glucose, Bld 111 (*)    All other components within normal limits  CBC  TROPONIN I  I-STAT TROPOININ, ED    DG Chest Portable 1 View  Final Result      No Follow-up on file.    Merrily Pew, MD 06/01/16 [phone]

## 2016-06-27 ENCOUNTER — Other Ambulatory Visit: Payer: Self-pay | Admitting: Family

## 2016-06-27 DIAGNOSIS — E049 Nontoxic goiter, unspecified: Secondary | ICD-10-CM

## 2016-06-29 ENCOUNTER — Other Ambulatory Visit: Payer: BLUE CROSS/BLUE SHIELD

## 2016-07-01 ENCOUNTER — Ambulatory Visit
Admission: RE | Admit: 2016-07-01 | Discharge: 2016-07-01 | Disposition: A | Discharge status: Home / Self Care | Payer: BLUE CROSS/BLUE SHIELD | Source: Ambulatory Visit | Attending: Family | Admitting: Family

## 2016-07-01 DIAGNOSIS — E049 Nontoxic goiter, unspecified: Secondary | ICD-10-CM

## 2017-01-17 ENCOUNTER — Other Ambulatory Visit: Payer: Self-pay | Admitting: Nurse Practitioner

## 2017-01-17 DIAGNOSIS — Z1231 Encounter for screening mammogram for malignant neoplasm of breast: Secondary | ICD-10-CM

## 2017-01-31 ENCOUNTER — Ambulatory Visit: Payer: BLUE CROSS/BLUE SHIELD

## 2017-02-07 ENCOUNTER — Ambulatory Visit
Admission: RE | Admit: 2017-02-07 | Discharge: 2017-02-07 | Disposition: A | Discharge status: Home / Self Care | Payer: BLUE CROSS/BLUE SHIELD | Source: Ambulatory Visit | Attending: Nurse Practitioner | Admitting: Nurse Practitioner

## 2017-02-07 DIAGNOSIS — Z1231 Encounter for screening mammogram for malignant neoplasm of breast: Secondary | ICD-10-CM

## 2017-10-30 DIAGNOSIS — E876 Hypokalemia: Secondary | ICD-10-CM | POA: Diagnosis not present

## 2017-10-30 DIAGNOSIS — I10 Essential (primary) hypertension: Secondary | ICD-10-CM | POA: Diagnosis not present

## 2017-10-30 DIAGNOSIS — E039 Hypothyroidism, unspecified: Secondary | ICD-10-CM | POA: Diagnosis not present

## 2018-01-09 ENCOUNTER — Other Ambulatory Visit: Payer: Self-pay | Admitting: Nurse Practitioner

## 2018-01-09 DIAGNOSIS — Z1231 Encounter for screening mammogram for malignant neoplasm of breast: Secondary | ICD-10-CM

## 2018-02-09 ENCOUNTER — Ambulatory Visit: Payer: BLUE CROSS/BLUE SHIELD

## 2018-03-02 DIAGNOSIS — H25042 Posterior subcapsular polar age-related cataract, left eye: Secondary | ICD-10-CM | POA: Diagnosis not present

## 2018-03-02 DIAGNOSIS — H2513 Age-related nuclear cataract, bilateral: Secondary | ICD-10-CM | POA: Diagnosis not present

## 2018-03-02 DIAGNOSIS — H25013 Cortical age-related cataract, bilateral: Secondary | ICD-10-CM | POA: Diagnosis not present

## 2018-03-21 DIAGNOSIS — Z23 Encounter for immunization: Secondary | ICD-10-CM | POA: Diagnosis not present

## 2018-03-22 ENCOUNTER — Ambulatory Visit
Admission: RE | Admit: 2018-03-22 | Discharge: 2018-03-22 | Disposition: A | Discharge status: Home / Self Care | Payer: Medicare Other | Source: Ambulatory Visit | Attending: Nurse Practitioner | Admitting: Nurse Practitioner

## 2018-03-22 DIAGNOSIS — Z1231 Encounter for screening mammogram for malignant neoplasm of breast: Secondary | ICD-10-CM

## 2018-05-07 ENCOUNTER — Other Ambulatory Visit: Payer: Self-pay | Admitting: Nurse Practitioner

## 2018-05-07 DIAGNOSIS — Z13220 Encounter for screening for lipoid disorders: Secondary | ICD-10-CM | POA: Diagnosis not present

## 2018-05-07 DIAGNOSIS — Z0000 Encounter for general adult medical examination without abnormal findings: Secondary | ICD-10-CM | POA: Diagnosis not present

## 2018-05-07 DIAGNOSIS — E559 Vitamin D deficiency, unspecified: Secondary | ICD-10-CM | POA: Diagnosis not present

## 2018-05-07 DIAGNOSIS — K219 Gastro-esophageal reflux disease without esophagitis: Secondary | ICD-10-CM | POA: Diagnosis not present

## 2018-05-07 DIAGNOSIS — G4733 Obstructive sleep apnea (adult) (pediatric): Secondary | ICD-10-CM | POA: Diagnosis not present

## 2018-05-07 DIAGNOSIS — M1990 Unspecified osteoarthritis, unspecified site: Secondary | ICD-10-CM | POA: Diagnosis not present

## 2018-05-07 DIAGNOSIS — Z13820 Encounter for screening for osteoporosis: Secondary | ICD-10-CM | POA: Diagnosis not present

## 2018-05-07 DIAGNOSIS — E039 Hypothyroidism, unspecified: Secondary | ICD-10-CM | POA: Diagnosis not present

## 2018-05-07 DIAGNOSIS — Z23 Encounter for immunization: Secondary | ICD-10-CM | POA: Diagnosis not present

## 2018-05-07 DIAGNOSIS — I10 Essential (primary) hypertension: Secondary | ICD-10-CM | POA: Diagnosis not present

## 2018-05-07 DIAGNOSIS — M810 Age-related osteoporosis without current pathological fracture: Secondary | ICD-10-CM

## 2018-06-22 DIAGNOSIS — G4733 Obstructive sleep apnea (adult) (pediatric): Secondary | ICD-10-CM | POA: Diagnosis not present

## 2018-07-02 ENCOUNTER — Other Ambulatory Visit: Payer: Self-pay | Admitting: Nurse Practitioner

## 2018-07-02 DIAGNOSIS — Z13820 Encounter for screening for osteoporosis: Secondary | ICD-10-CM

## 2018-07-03 ENCOUNTER — Ambulatory Visit
Admission: RE | Admit: 2018-07-03 | Discharge: 2018-07-03 | Disposition: A | Discharge status: Home / Self Care | Payer: Medicare HMO | Source: Ambulatory Visit | Attending: Nurse Practitioner | Admitting: Nurse Practitioner

## 2018-07-03 DIAGNOSIS — Z780 Asymptomatic menopausal state: Secondary | ICD-10-CM | POA: Diagnosis not present

## 2018-07-03 DIAGNOSIS — Z13820 Encounter for screening for osteoporosis: Secondary | ICD-10-CM

## 2018-07-23 DIAGNOSIS — G4733 Obstructive sleep apnea (adult) (pediatric): Secondary | ICD-10-CM | POA: Diagnosis not present

## 2018-08-01 DIAGNOSIS — R32 Unspecified urinary incontinence: Secondary | ICD-10-CM | POA: Diagnosis not present

## 2018-08-01 DIAGNOSIS — Z8249 Family history of ischemic heart disease and other diseases of the circulatory system: Secondary | ICD-10-CM | POA: Diagnosis not present

## 2018-08-01 DIAGNOSIS — Z6834 Body mass index (BMI) 34.0-34.9, adult: Secondary | ICD-10-CM | POA: Diagnosis not present

## 2018-08-01 DIAGNOSIS — E039 Hypothyroidism, unspecified: Secondary | ICD-10-CM | POA: Diagnosis not present

## 2018-08-01 DIAGNOSIS — Z9181 History of falling: Secondary | ICD-10-CM | POA: Diagnosis not present

## 2018-08-01 DIAGNOSIS — Z833 Family history of diabetes mellitus: Secondary | ICD-10-CM | POA: Diagnosis not present

## 2018-08-01 DIAGNOSIS — E669 Obesity, unspecified: Secondary | ICD-10-CM | POA: Diagnosis not present

## 2018-08-01 DIAGNOSIS — I10 Essential (primary) hypertension: Secondary | ICD-10-CM | POA: Diagnosis not present

## 2018-08-01 DIAGNOSIS — Z7982 Long term (current) use of aspirin: Secondary | ICD-10-CM | POA: Diagnosis not present

## 2018-08-01 DIAGNOSIS — G4730 Sleep apnea, unspecified: Secondary | ICD-10-CM | POA: Diagnosis not present

## 2018-08-13 DIAGNOSIS — G4733 Obstructive sleep apnea (adult) (pediatric): Secondary | ICD-10-CM | POA: Diagnosis not present

## 2018-08-21 DIAGNOSIS — G4733 Obstructive sleep apnea (adult) (pediatric): Secondary | ICD-10-CM | POA: Diagnosis not present

## 2018-09-21 DIAGNOSIS — G4733 Obstructive sleep apnea (adult) (pediatric): Secondary | ICD-10-CM | POA: Diagnosis not present

## 2018-10-02 DIAGNOSIS — G4733 Obstructive sleep apnea (adult) (pediatric): Secondary | ICD-10-CM | POA: Diagnosis not present

## 2018-10-21 DIAGNOSIS — G4733 Obstructive sleep apnea (adult) (pediatric): Secondary | ICD-10-CM | POA: Diagnosis not present

## 2018-11-09 DIAGNOSIS — E039 Hypothyroidism, unspecified: Secondary | ICD-10-CM | POA: Diagnosis not present

## 2018-11-09 DIAGNOSIS — G4733 Obstructive sleep apnea (adult) (pediatric): Secondary | ICD-10-CM | POA: Diagnosis not present

## 2018-11-09 DIAGNOSIS — I10 Essential (primary) hypertension: Secondary | ICD-10-CM | POA: Diagnosis not present

## 2018-11-09 DIAGNOSIS — M1990 Unspecified osteoarthritis, unspecified site: Secondary | ICD-10-CM | POA: Diagnosis not present

## 2018-11-21 DIAGNOSIS — G4733 Obstructive sleep apnea (adult) (pediatric): Secondary | ICD-10-CM | POA: Diagnosis not present

## 2018-12-21 DIAGNOSIS — G4733 Obstructive sleep apnea (adult) (pediatric): Secondary | ICD-10-CM | POA: Diagnosis not present

## 2019-01-01 DIAGNOSIS — G4733 Obstructive sleep apnea (adult) (pediatric): Secondary | ICD-10-CM | POA: Diagnosis not present

## 2019-01-21 DIAGNOSIS — G4733 Obstructive sleep apnea (adult) (pediatric): Secondary | ICD-10-CM | POA: Diagnosis not present

## 2019-02-01 DIAGNOSIS — G4733 Obstructive sleep apnea (adult) (pediatric): Secondary | ICD-10-CM | POA: Diagnosis not present

## 2019-02-11 ENCOUNTER — Other Ambulatory Visit: Payer: Self-pay | Admitting: Internal Medicine

## 2019-02-11 DIAGNOSIS — Z1231 Encounter for screening mammogram for malignant neoplasm of breast: Secondary | ICD-10-CM

## 2019-02-20 DIAGNOSIS — G4733 Obstructive sleep apnea (adult) (pediatric): Secondary | ICD-10-CM | POA: Diagnosis not present

## 2019-02-20 DIAGNOSIS — M1711 Unilateral primary osteoarthritis, right knee: Secondary | ICD-10-CM | POA: Diagnosis not present

## 2019-02-20 DIAGNOSIS — W19XXXA Unspecified fall, initial encounter: Secondary | ICD-10-CM | POA: Diagnosis not present

## 2019-02-20 DIAGNOSIS — I10 Essential (primary) hypertension: Secondary | ICD-10-CM | POA: Diagnosis not present

## 2019-02-20 DIAGNOSIS — E039 Hypothyroidism, unspecified: Secondary | ICD-10-CM | POA: Diagnosis not present

## 2019-02-21 DIAGNOSIS — G4733 Obstructive sleep apnea (adult) (pediatric): Secondary | ICD-10-CM | POA: Diagnosis not present

## 2019-03-04 DIAGNOSIS — G4733 Obstructive sleep apnea (adult) (pediatric): Secondary | ICD-10-CM | POA: Diagnosis not present

## 2019-03-23 DIAGNOSIS — G4733 Obstructive sleep apnea (adult) (pediatric): Secondary | ICD-10-CM | POA: Diagnosis not present

## 2019-03-26 ENCOUNTER — Ambulatory Visit
Admission: RE | Admit: 2019-03-26 | Discharge: 2019-03-26 | Disposition: A | Discharge status: Home / Self Care | Payer: Medicare HMO | Source: Ambulatory Visit | Attending: Internal Medicine | Admitting: Internal Medicine

## 2019-03-26 ENCOUNTER — Other Ambulatory Visit: Payer: Self-pay

## 2019-03-26 DIAGNOSIS — Z1231 Encounter for screening mammogram for malignant neoplasm of breast: Secondary | ICD-10-CM

## 2019-04-10 DIAGNOSIS — Z23 Encounter for immunization: Secondary | ICD-10-CM | POA: Diagnosis not present

## 2019-04-10 DIAGNOSIS — G4733 Obstructive sleep apnea (adult) (pediatric): Secondary | ICD-10-CM | POA: Diagnosis not present

## 2019-04-30 DIAGNOSIS — Z20828 Contact with and (suspected) exposure to other viral communicable diseases: Secondary | ICD-10-CM | POA: Diagnosis not present

## 2019-05-11 DIAGNOSIS — G4733 Obstructive sleep apnea (adult) (pediatric): Secondary | ICD-10-CM | POA: Diagnosis not present

## 2019-05-13 DIAGNOSIS — H25013 Cortical age-related cataract, bilateral: Secondary | ICD-10-CM | POA: Diagnosis not present

## 2019-05-13 DIAGNOSIS — H25042 Posterior subcapsular polar age-related cataract, left eye: Secondary | ICD-10-CM | POA: Diagnosis not present

## 2019-05-13 DIAGNOSIS — H2513 Age-related nuclear cataract, bilateral: Secondary | ICD-10-CM | POA: Diagnosis not present

## 2019-05-22 DIAGNOSIS — Z1159 Encounter for screening for other viral diseases: Secondary | ICD-10-CM | POA: Diagnosis not present

## 2019-05-22 DIAGNOSIS — Z20828 Contact with and (suspected) exposure to other viral communicable diseases: Secondary | ICD-10-CM | POA: Diagnosis not present

## 2019-06-10 DIAGNOSIS — G4733 Obstructive sleep apnea (adult) (pediatric): Secondary | ICD-10-CM | POA: Diagnosis not present

## 2019-06-17 DIAGNOSIS — M1711 Unilateral primary osteoarthritis, right knee: Secondary | ICD-10-CM | POA: Diagnosis not present

## 2019-06-17 DIAGNOSIS — Z1211 Encounter for screening for malignant neoplasm of colon: Secondary | ICD-10-CM | POA: Diagnosis not present

## 2019-06-17 DIAGNOSIS — Z0000 Encounter for general adult medical examination without abnormal findings: Secondary | ICD-10-CM | POA: Diagnosis not present

## 2019-06-17 DIAGNOSIS — Z136 Encounter for screening for cardiovascular disorders: Secondary | ICD-10-CM | POA: Diagnosis not present

## 2019-06-17 DIAGNOSIS — E039 Hypothyroidism, unspecified: Secondary | ICD-10-CM | POA: Diagnosis not present

## 2019-06-17 DIAGNOSIS — G4733 Obstructive sleep apnea (adult) (pediatric): Secondary | ICD-10-CM | POA: Diagnosis not present

## 2019-06-17 DIAGNOSIS — I10 Essential (primary) hypertension: Secondary | ICD-10-CM | POA: Diagnosis not present

## 2019-06-17 DIAGNOSIS — Z1231 Encounter for screening mammogram for malignant neoplasm of breast: Secondary | ICD-10-CM | POA: Diagnosis not present

## 2019-06-17 DIAGNOSIS — Z23 Encounter for immunization: Secondary | ICD-10-CM | POA: Diagnosis not present

## 2019-06-17 DIAGNOSIS — Z1389 Encounter for screening for other disorder: Secondary | ICD-10-CM | POA: Diagnosis not present

## 2019-06-17 DIAGNOSIS — H538 Other visual disturbances: Secondary | ICD-10-CM | POA: Diagnosis not present

## 2019-06-29 DIAGNOSIS — Z20828 Contact with and (suspected) exposure to other viral communicable diseases: Secondary | ICD-10-CM | POA: Diagnosis not present

## 2019-06-29 DIAGNOSIS — Z1159 Encounter for screening for other viral diseases: Secondary | ICD-10-CM | POA: Diagnosis not present

## 2019-07-08 DIAGNOSIS — Z20828 Contact with and (suspected) exposure to other viral communicable diseases: Secondary | ICD-10-CM | POA: Diagnosis not present

## 2019-07-08 DIAGNOSIS — Z1159 Encounter for screening for other viral diseases: Secondary | ICD-10-CM | POA: Diagnosis not present

## 2019-07-11 DIAGNOSIS — Z20828 Contact with and (suspected) exposure to other viral communicable diseases: Secondary | ICD-10-CM | POA: Diagnosis not present

## 2019-07-11 DIAGNOSIS — Z1159 Encounter for screening for other viral diseases: Secondary | ICD-10-CM | POA: Diagnosis not present

## 2019-07-12 DIAGNOSIS — G4733 Obstructive sleep apnea (adult) (pediatric): Secondary | ICD-10-CM | POA: Diagnosis not present

## 2019-07-15 DIAGNOSIS — Z20828 Contact with and (suspected) exposure to other viral communicable diseases: Secondary | ICD-10-CM | POA: Diagnosis not present

## 2019-07-15 DIAGNOSIS — Z1159 Encounter for screening for other viral diseases: Secondary | ICD-10-CM | POA: Diagnosis not present

## 2019-07-18 DIAGNOSIS — Z20828 Contact with and (suspected) exposure to other viral communicable diseases: Secondary | ICD-10-CM | POA: Diagnosis not present

## 2019-07-18 DIAGNOSIS — Z1159 Encounter for screening for other viral diseases: Secondary | ICD-10-CM | POA: Diagnosis not present

## 2019-07-22 DIAGNOSIS — Z1159 Encounter for screening for other viral diseases: Secondary | ICD-10-CM | POA: Diagnosis not present

## 2019-07-22 DIAGNOSIS — Z20828 Contact with and (suspected) exposure to other viral communicable diseases: Secondary | ICD-10-CM | POA: Diagnosis not present

## 2019-07-25 DIAGNOSIS — Z20828 Contact with and (suspected) exposure to other viral communicable diseases: Secondary | ICD-10-CM | POA: Diagnosis not present

## 2019-07-25 DIAGNOSIS — Z1159 Encounter for screening for other viral diseases: Secondary | ICD-10-CM | POA: Diagnosis not present

## 2019-07-29 DIAGNOSIS — H16223 Keratoconjunctivitis sicca, not specified as Sjogren's, bilateral: Secondary | ICD-10-CM | POA: Diagnosis not present

## 2019-08-01 DIAGNOSIS — Z1159 Encounter for screening for other viral diseases: Secondary | ICD-10-CM | POA: Diagnosis not present

## 2019-08-01 DIAGNOSIS — Z20828 Contact with and (suspected) exposure to other viral communicable diseases: Secondary | ICD-10-CM | POA: Diagnosis not present

## 2019-08-05 DIAGNOSIS — Z20828 Contact with and (suspected) exposure to other viral communicable diseases: Secondary | ICD-10-CM | POA: Diagnosis not present

## 2019-08-05 DIAGNOSIS — Z1159 Encounter for screening for other viral diseases: Secondary | ICD-10-CM | POA: Diagnosis not present

## 2019-08-08 DIAGNOSIS — Z1159 Encounter for screening for other viral diseases: Secondary | ICD-10-CM | POA: Diagnosis not present

## 2019-08-08 DIAGNOSIS — Z20828 Contact with and (suspected) exposure to other viral communicable diseases: Secondary | ICD-10-CM | POA: Diagnosis not present

## 2019-08-11 DIAGNOSIS — G4733 Obstructive sleep apnea (adult) (pediatric): Secondary | ICD-10-CM | POA: Diagnosis not present

## 2019-08-12 DIAGNOSIS — Z1159 Encounter for screening for other viral diseases: Secondary | ICD-10-CM | POA: Diagnosis not present

## 2019-08-12 DIAGNOSIS — Z20828 Contact with and (suspected) exposure to other viral communicable diseases: Secondary | ICD-10-CM | POA: Diagnosis not present

## 2019-08-15 DIAGNOSIS — Z20828 Contact with and (suspected) exposure to other viral communicable diseases: Secondary | ICD-10-CM | POA: Diagnosis not present

## 2019-08-15 DIAGNOSIS — Z1159 Encounter for screening for other viral diseases: Secondary | ICD-10-CM | POA: Diagnosis not present

## 2019-08-19 DIAGNOSIS — Z20828 Contact with and (suspected) exposure to other viral communicable diseases: Secondary | ICD-10-CM | POA: Diagnosis not present

## 2019-08-19 DIAGNOSIS — Z1159 Encounter for screening for other viral diseases: Secondary | ICD-10-CM | POA: Diagnosis not present

## 2019-08-19 DIAGNOSIS — G4733 Obstructive sleep apnea (adult) (pediatric): Secondary | ICD-10-CM | POA: Diagnosis not present

## 2019-08-22 DIAGNOSIS — Z1159 Encounter for screening for other viral diseases: Secondary | ICD-10-CM | POA: Diagnosis not present

## 2019-08-22 DIAGNOSIS — Z20828 Contact with and (suspected) exposure to other viral communicable diseases: Secondary | ICD-10-CM | POA: Diagnosis not present

## 2019-08-29 DIAGNOSIS — Z20828 Contact with and (suspected) exposure to other viral communicable diseases: Secondary | ICD-10-CM | POA: Diagnosis not present

## 2019-08-29 DIAGNOSIS — Z1159 Encounter for screening for other viral diseases: Secondary | ICD-10-CM | POA: Diagnosis not present

## 2019-09-02 DIAGNOSIS — Z20828 Contact with and (suspected) exposure to other viral communicable diseases: Secondary | ICD-10-CM | POA: Diagnosis not present

## 2019-09-02 DIAGNOSIS — Z1159 Encounter for screening for other viral diseases: Secondary | ICD-10-CM | POA: Diagnosis not present

## 2019-09-04 DIAGNOSIS — G8929 Other chronic pain: Secondary | ICD-10-CM | POA: Diagnosis not present

## 2019-09-04 DIAGNOSIS — M1990 Unspecified osteoarthritis, unspecified site: Secondary | ICD-10-CM | POA: Diagnosis not present

## 2019-09-04 DIAGNOSIS — Z791 Long term (current) use of non-steroidal anti-inflammatories (NSAID): Secondary | ICD-10-CM | POA: Diagnosis not present

## 2019-09-04 DIAGNOSIS — Z008 Encounter for other general examination: Secondary | ICD-10-CM | POA: Diagnosis not present

## 2019-09-04 DIAGNOSIS — K219 Gastro-esophageal reflux disease without esophagitis: Secondary | ICD-10-CM | POA: Diagnosis not present

## 2019-09-04 DIAGNOSIS — I10 Essential (primary) hypertension: Secondary | ICD-10-CM | POA: Diagnosis not present

## 2019-09-04 DIAGNOSIS — I739 Peripheral vascular disease, unspecified: Secondary | ICD-10-CM | POA: Diagnosis not present

## 2019-09-04 DIAGNOSIS — R32 Unspecified urinary incontinence: Secondary | ICD-10-CM | POA: Diagnosis not present

## 2019-09-04 DIAGNOSIS — G4730 Sleep apnea, unspecified: Secondary | ICD-10-CM | POA: Diagnosis not present

## 2019-09-04 DIAGNOSIS — E039 Hypothyroidism, unspecified: Secondary | ICD-10-CM | POA: Diagnosis not present

## 2019-09-05 DIAGNOSIS — Z20828 Contact with and (suspected) exposure to other viral communicable diseases: Secondary | ICD-10-CM | POA: Diagnosis not present

## 2019-09-05 DIAGNOSIS — Z1159 Encounter for screening for other viral diseases: Secondary | ICD-10-CM | POA: Diagnosis not present

## 2019-09-09 DIAGNOSIS — G4733 Obstructive sleep apnea (adult) (pediatric): Secondary | ICD-10-CM | POA: Diagnosis not present

## 2019-09-10 ENCOUNTER — Other Ambulatory Visit: Payer: Self-pay | Admitting: Internal Medicine

## 2019-09-10 DIAGNOSIS — E785 Hyperlipidemia, unspecified: Secondary | ICD-10-CM | POA: Diagnosis not present

## 2019-09-10 DIAGNOSIS — R6889 Other general symptoms and signs: Secondary | ICD-10-CM

## 2019-09-10 DIAGNOSIS — I10 Essential (primary) hypertension: Secondary | ICD-10-CM | POA: Diagnosis not present

## 2019-09-12 DIAGNOSIS — Z20828 Contact with and (suspected) exposure to other viral communicable diseases: Secondary | ICD-10-CM | POA: Diagnosis not present

## 2019-09-12 DIAGNOSIS — Z1159 Encounter for screening for other viral diseases: Secondary | ICD-10-CM | POA: Diagnosis not present

## 2019-09-13 ENCOUNTER — Other Ambulatory Visit: Payer: Medicare HMO

## 2019-09-16 DIAGNOSIS — Z20828 Contact with and (suspected) exposure to other viral communicable diseases: Secondary | ICD-10-CM | POA: Diagnosis not present

## 2019-09-16 DIAGNOSIS — Z1159 Encounter for screening for other viral diseases: Secondary | ICD-10-CM | POA: Diagnosis not present

## 2019-09-17 DIAGNOSIS — I10 Essential (primary) hypertension: Secondary | ICD-10-CM | POA: Diagnosis not present

## 2019-09-17 DIAGNOSIS — M1711 Unilateral primary osteoarthritis, right knee: Secondary | ICD-10-CM | POA: Diagnosis not present

## 2019-09-17 DIAGNOSIS — M1990 Unspecified osteoarthritis, unspecified site: Secondary | ICD-10-CM | POA: Diagnosis not present

## 2019-09-17 DIAGNOSIS — E785 Hyperlipidemia, unspecified: Secondary | ICD-10-CM | POA: Diagnosis not present

## 2019-09-17 DIAGNOSIS — E039 Hypothyroidism, unspecified: Secondary | ICD-10-CM | POA: Diagnosis not present

## 2019-09-19 DIAGNOSIS — Z20828 Contact with and (suspected) exposure to other viral communicable diseases: Secondary | ICD-10-CM | POA: Diagnosis not present

## 2019-09-19 DIAGNOSIS — Z1159 Encounter for screening for other viral diseases: Secondary | ICD-10-CM | POA: Diagnosis not present

## 2019-09-26 DIAGNOSIS — Z1159 Encounter for screening for other viral diseases: Secondary | ICD-10-CM | POA: Diagnosis not present

## 2019-09-26 DIAGNOSIS — Z20828 Contact with and (suspected) exposure to other viral communicable diseases: Secondary | ICD-10-CM | POA: Diagnosis not present

## 2019-09-30 DIAGNOSIS — H0288A Meibomian gland dysfunction right eye, upper and lower eyelids: Secondary | ICD-10-CM | POA: Diagnosis not present

## 2019-09-30 DIAGNOSIS — H16223 Keratoconjunctivitis sicca, not specified as Sjogren's, bilateral: Secondary | ICD-10-CM | POA: Diagnosis not present

## 2019-09-30 DIAGNOSIS — Z20828 Contact with and (suspected) exposure to other viral communicable diseases: Secondary | ICD-10-CM | POA: Diagnosis not present

## 2019-09-30 DIAGNOSIS — Z1159 Encounter for screening for other viral diseases: Secondary | ICD-10-CM | POA: Diagnosis not present

## 2019-09-30 DIAGNOSIS — H0288B Meibomian gland dysfunction left eye, upper and lower eyelids: Secondary | ICD-10-CM | POA: Diagnosis not present

## 2019-10-03 DIAGNOSIS — Z20828 Contact with and (suspected) exposure to other viral communicable diseases: Secondary | ICD-10-CM | POA: Diagnosis not present

## 2019-10-03 DIAGNOSIS — Z1159 Encounter for screening for other viral diseases: Secondary | ICD-10-CM | POA: Diagnosis not present

## 2019-10-10 DIAGNOSIS — G4733 Obstructive sleep apnea (adult) (pediatric): Secondary | ICD-10-CM | POA: Diagnosis not present

## 2019-10-14 DIAGNOSIS — Z1159 Encounter for screening for other viral diseases: Secondary | ICD-10-CM | POA: Diagnosis not present

## 2019-10-14 DIAGNOSIS — Z20828 Contact with and (suspected) exposure to other viral communicable diseases: Secondary | ICD-10-CM | POA: Diagnosis not present

## 2019-10-17 DIAGNOSIS — Z20828 Contact with and (suspected) exposure to other viral communicable diseases: Secondary | ICD-10-CM | POA: Diagnosis not present

## 2019-10-17 DIAGNOSIS — Z1159 Encounter for screening for other viral diseases: Secondary | ICD-10-CM | POA: Diagnosis not present

## 2019-10-24 DIAGNOSIS — Z1159 Encounter for screening for other viral diseases: Secondary | ICD-10-CM | POA: Diagnosis not present

## 2019-10-24 DIAGNOSIS — Z20828 Contact with and (suspected) exposure to other viral communicable diseases: Secondary | ICD-10-CM | POA: Diagnosis not present

## 2019-10-31 DIAGNOSIS — Z1159 Encounter for screening for other viral diseases: Secondary | ICD-10-CM | POA: Diagnosis not present

## 2019-10-31 DIAGNOSIS — Z20828 Contact with and (suspected) exposure to other viral communicable diseases: Secondary | ICD-10-CM | POA: Diagnosis not present

## 2019-11-07 DIAGNOSIS — Z1159 Encounter for screening for other viral diseases: Secondary | ICD-10-CM | POA: Diagnosis not present

## 2019-11-07 DIAGNOSIS — Z20828 Contact with and (suspected) exposure to other viral communicable diseases: Secondary | ICD-10-CM | POA: Diagnosis not present

## 2019-11-09 DIAGNOSIS — G4733 Obstructive sleep apnea (adult) (pediatric): Secondary | ICD-10-CM | POA: Diagnosis not present

## 2019-11-11 DIAGNOSIS — Z20828 Contact with and (suspected) exposure to other viral communicable diseases: Secondary | ICD-10-CM | POA: Diagnosis not present

## 2019-11-11 DIAGNOSIS — Z1159 Encounter for screening for other viral diseases: Secondary | ICD-10-CM | POA: Diagnosis not present

## 2019-11-21 DIAGNOSIS — Z20828 Contact with and (suspected) exposure to other viral communicable diseases: Secondary | ICD-10-CM | POA: Diagnosis not present

## 2019-11-21 DIAGNOSIS — Z1159 Encounter for screening for other viral diseases: Secondary | ICD-10-CM | POA: Diagnosis not present

## 2019-11-25 DIAGNOSIS — Z20828 Contact with and (suspected) exposure to other viral communicable diseases: Secondary | ICD-10-CM | POA: Diagnosis not present

## 2019-11-25 DIAGNOSIS — Z1159 Encounter for screening for other viral diseases: Secondary | ICD-10-CM | POA: Diagnosis not present

## 2019-11-30 DIAGNOSIS — M1990 Unspecified osteoarthritis, unspecified site: Secondary | ICD-10-CM | POA: Diagnosis not present

## 2019-11-30 DIAGNOSIS — I10 Essential (primary) hypertension: Secondary | ICD-10-CM | POA: Diagnosis not present

## 2019-11-30 DIAGNOSIS — E785 Hyperlipidemia, unspecified: Secondary | ICD-10-CM | POA: Diagnosis not present

## 2019-11-30 DIAGNOSIS — M1711 Unilateral primary osteoarthritis, right knee: Secondary | ICD-10-CM | POA: Diagnosis not present

## 2019-11-30 DIAGNOSIS — E039 Hypothyroidism, unspecified: Secondary | ICD-10-CM | POA: Diagnosis not present

## 2019-12-05 DIAGNOSIS — Z1159 Encounter for screening for other viral diseases: Secondary | ICD-10-CM | POA: Diagnosis not present

## 2019-12-05 DIAGNOSIS — Z20828 Contact with and (suspected) exposure to other viral communicable diseases: Secondary | ICD-10-CM | POA: Diagnosis not present

## 2019-12-09 DIAGNOSIS — Z1159 Encounter for screening for other viral diseases: Secondary | ICD-10-CM | POA: Diagnosis not present

## 2019-12-09 DIAGNOSIS — Z20828 Contact with and (suspected) exposure to other viral communicable diseases: Secondary | ICD-10-CM | POA: Diagnosis not present

## 2019-12-10 DIAGNOSIS — G4733 Obstructive sleep apnea (adult) (pediatric): Secondary | ICD-10-CM | POA: Diagnosis not present

## 2019-12-23 DIAGNOSIS — Z20828 Contact with and (suspected) exposure to other viral communicable diseases: Secondary | ICD-10-CM | POA: Diagnosis not present

## 2019-12-23 DIAGNOSIS — Z1159 Encounter for screening for other viral diseases: Secondary | ICD-10-CM | POA: Diagnosis not present

## 2020-01-02 DIAGNOSIS — Z1159 Encounter for screening for other viral diseases: Secondary | ICD-10-CM | POA: Diagnosis not present

## 2020-01-02 DIAGNOSIS — Z20828 Contact with and (suspected) exposure to other viral communicable diseases: Secondary | ICD-10-CM | POA: Diagnosis not present

## 2020-01-03 DIAGNOSIS — M1711 Unilateral primary osteoarthritis, right knee: Secondary | ICD-10-CM | POA: Diagnosis not present

## 2020-01-03 DIAGNOSIS — E039 Hypothyroidism, unspecified: Secondary | ICD-10-CM | POA: Diagnosis not present

## 2020-01-03 DIAGNOSIS — M1990 Unspecified osteoarthritis, unspecified site: Secondary | ICD-10-CM | POA: Diagnosis not present

## 2020-01-03 DIAGNOSIS — E785 Hyperlipidemia, unspecified: Secondary | ICD-10-CM | POA: Diagnosis not present

## 2020-01-03 DIAGNOSIS — I10 Essential (primary) hypertension: Secondary | ICD-10-CM | POA: Diagnosis not present

## 2020-01-06 DIAGNOSIS — Z20828 Contact with and (suspected) exposure to other viral communicable diseases: Secondary | ICD-10-CM | POA: Diagnosis not present

## 2020-01-06 DIAGNOSIS — Z1159 Encounter for screening for other viral diseases: Secondary | ICD-10-CM | POA: Diagnosis not present

## 2020-01-09 DIAGNOSIS — G4733 Obstructive sleep apnea (adult) (pediatric): Secondary | ICD-10-CM | POA: Diagnosis not present

## 2020-01-16 DIAGNOSIS — Z20828 Contact with and (suspected) exposure to other viral communicable diseases: Secondary | ICD-10-CM | POA: Diagnosis not present

## 2020-01-16 DIAGNOSIS — Z1159 Encounter for screening for other viral diseases: Secondary | ICD-10-CM | POA: Diagnosis not present

## 2020-01-20 DIAGNOSIS — Z1159 Encounter for screening for other viral diseases: Secondary | ICD-10-CM | POA: Diagnosis not present

## 2020-01-20 DIAGNOSIS — Z20828 Contact with and (suspected) exposure to other viral communicable diseases: Secondary | ICD-10-CM | POA: Diagnosis not present

## 2020-02-03 DIAGNOSIS — Z20828 Contact with and (suspected) exposure to other viral communicable diseases: Secondary | ICD-10-CM | POA: Diagnosis not present

## 2020-02-03 DIAGNOSIS — Z1159 Encounter for screening for other viral diseases: Secondary | ICD-10-CM | POA: Diagnosis not present

## 2020-02-03 DIAGNOSIS — H0288B Meibomian gland dysfunction left eye, upper and lower eyelids: Secondary | ICD-10-CM | POA: Diagnosis not present

## 2020-02-03 DIAGNOSIS — H0288A Meibomian gland dysfunction right eye, upper and lower eyelids: Secondary | ICD-10-CM | POA: Diagnosis not present

## 2020-02-03 DIAGNOSIS — H16223 Keratoconjunctivitis sicca, not specified as Sjogren's, bilateral: Secondary | ICD-10-CM | POA: Diagnosis not present

## 2020-02-04 DIAGNOSIS — M1711 Unilateral primary osteoarthritis, right knee: Secondary | ICD-10-CM | POA: Diagnosis not present

## 2020-02-04 DIAGNOSIS — E039 Hypothyroidism, unspecified: Secondary | ICD-10-CM | POA: Diagnosis not present

## 2020-02-04 DIAGNOSIS — I10 Essential (primary) hypertension: Secondary | ICD-10-CM | POA: Diagnosis not present

## 2020-02-04 DIAGNOSIS — E785 Hyperlipidemia, unspecified: Secondary | ICD-10-CM | POA: Diagnosis not present

## 2020-02-04 DIAGNOSIS — M1990 Unspecified osteoarthritis, unspecified site: Secondary | ICD-10-CM | POA: Diagnosis not present

## 2020-02-06 DIAGNOSIS — Z1159 Encounter for screening for other viral diseases: Secondary | ICD-10-CM | POA: Diagnosis not present

## 2020-02-06 DIAGNOSIS — Z20828 Contact with and (suspected) exposure to other viral communicable diseases: Secondary | ICD-10-CM | POA: Diagnosis not present

## 2020-02-09 DIAGNOSIS — G4733 Obstructive sleep apnea (adult) (pediatric): Secondary | ICD-10-CM | POA: Diagnosis not present

## 2020-02-13 DIAGNOSIS — Z20828 Contact with and (suspected) exposure to other viral communicable diseases: Secondary | ICD-10-CM | POA: Diagnosis not present

## 2020-02-13 DIAGNOSIS — Z1159 Encounter for screening for other viral diseases: Secondary | ICD-10-CM | POA: Diagnosis not present

## 2020-02-17 DIAGNOSIS — Z1159 Encounter for screening for other viral diseases: Secondary | ICD-10-CM | POA: Diagnosis not present

## 2020-02-17 DIAGNOSIS — Z20828 Contact with and (suspected) exposure to other viral communicable diseases: Secondary | ICD-10-CM | POA: Diagnosis not present

## 2020-02-24 ENCOUNTER — Other Ambulatory Visit: Payer: Self-pay | Admitting: Internal Medicine

## 2020-02-24 DIAGNOSIS — Z1231 Encounter for screening mammogram for malignant neoplasm of breast: Secondary | ICD-10-CM

## 2020-03-02 DIAGNOSIS — Z1159 Encounter for screening for other viral diseases: Secondary | ICD-10-CM | POA: Diagnosis not present

## 2020-03-02 DIAGNOSIS — Z20828 Contact with and (suspected) exposure to other viral communicable diseases: Secondary | ICD-10-CM | POA: Diagnosis not present

## 2020-03-04 DIAGNOSIS — E039 Hypothyroidism, unspecified: Secondary | ICD-10-CM | POA: Diagnosis not present

## 2020-03-04 DIAGNOSIS — M1990 Unspecified osteoarthritis, unspecified site: Secondary | ICD-10-CM | POA: Diagnosis not present

## 2020-03-04 DIAGNOSIS — E785 Hyperlipidemia, unspecified: Secondary | ICD-10-CM | POA: Diagnosis not present

## 2020-03-04 DIAGNOSIS — I10 Essential (primary) hypertension: Secondary | ICD-10-CM | POA: Diagnosis not present

## 2020-03-04 DIAGNOSIS — M1711 Unilateral primary osteoarthritis, right knee: Secondary | ICD-10-CM | POA: Diagnosis not present

## 2020-03-11 DIAGNOSIS — G4733 Obstructive sleep apnea (adult) (pediatric): Secondary | ICD-10-CM | POA: Diagnosis not present

## 2020-03-12 DIAGNOSIS — Z1159 Encounter for screening for other viral diseases: Secondary | ICD-10-CM | POA: Diagnosis not present

## 2020-03-12 DIAGNOSIS — Z20828 Contact with and (suspected) exposure to other viral communicable diseases: Secondary | ICD-10-CM | POA: Diagnosis not present

## 2020-03-16 DIAGNOSIS — Z1159 Encounter for screening for other viral diseases: Secondary | ICD-10-CM | POA: Diagnosis not present

## 2020-03-16 DIAGNOSIS — Z20828 Contact with and (suspected) exposure to other viral communicable diseases: Secondary | ICD-10-CM | POA: Diagnosis not present

## 2020-03-26 DIAGNOSIS — Z1159 Encounter for screening for other viral diseases: Secondary | ICD-10-CM | POA: Diagnosis not present

## 2020-03-26 DIAGNOSIS — Z20828 Contact with and (suspected) exposure to other viral communicable diseases: Secondary | ICD-10-CM | POA: Diagnosis not present

## 2020-03-30 DIAGNOSIS — Z20828 Contact with and (suspected) exposure to other viral communicable diseases: Secondary | ICD-10-CM | POA: Diagnosis not present

## 2020-03-30 DIAGNOSIS — Z1159 Encounter for screening for other viral diseases: Secondary | ICD-10-CM | POA: Diagnosis not present

## 2020-03-31 ENCOUNTER — Other Ambulatory Visit: Payer: Self-pay

## 2020-03-31 ENCOUNTER — Ambulatory Visit
Admission: RE | Admit: 2020-03-31 | Discharge: 2020-03-31 | Disposition: A | Discharge status: Home / Self Care | Payer: Medicare HMO | Source: Ambulatory Visit | Attending: Internal Medicine | Admitting: Internal Medicine

## 2020-03-31 DIAGNOSIS — Z1231 Encounter for screening mammogram for malignant neoplasm of breast: Secondary | ICD-10-CM

## 2020-04-02 DIAGNOSIS — I10 Essential (primary) hypertension: Secondary | ICD-10-CM | POA: Diagnosis not present

## 2020-04-02 DIAGNOSIS — M1711 Unilateral primary osteoarthritis, right knee: Secondary | ICD-10-CM | POA: Diagnosis not present

## 2020-04-02 DIAGNOSIS — E785 Hyperlipidemia, unspecified: Secondary | ICD-10-CM | POA: Diagnosis not present

## 2020-04-02 DIAGNOSIS — E039 Hypothyroidism, unspecified: Secondary | ICD-10-CM | POA: Diagnosis not present

## 2020-04-02 DIAGNOSIS — M1990 Unspecified osteoarthritis, unspecified site: Secondary | ICD-10-CM | POA: Diagnosis not present

## 2020-04-03 ENCOUNTER — Other Ambulatory Visit: Payer: Self-pay | Admitting: Internal Medicine

## 2020-04-03 DIAGNOSIS — R928 Other abnormal and inconclusive findings on diagnostic imaging of breast: Secondary | ICD-10-CM

## 2020-04-09 DIAGNOSIS — Z1159 Encounter for screening for other viral diseases: Secondary | ICD-10-CM | POA: Diagnosis not present

## 2020-04-09 DIAGNOSIS — Z20828 Contact with and (suspected) exposure to other viral communicable diseases: Secondary | ICD-10-CM | POA: Diagnosis not present

## 2020-04-13 DIAGNOSIS — Z20828 Contact with and (suspected) exposure to other viral communicable diseases: Secondary | ICD-10-CM | POA: Diagnosis not present

## 2020-04-13 DIAGNOSIS — Z1159 Encounter for screening for other viral diseases: Secondary | ICD-10-CM | POA: Diagnosis not present

## 2020-04-21 ENCOUNTER — Ambulatory Visit
Admission: RE | Admit: 2020-04-21 | Discharge: 2020-04-21 | Disposition: A | Discharge status: Home / Self Care | Payer: Medicare HMO | Source: Ambulatory Visit | Attending: Internal Medicine | Admitting: Internal Medicine

## 2020-04-21 ENCOUNTER — Other Ambulatory Visit: Payer: Self-pay | Admitting: Internal Medicine

## 2020-04-21 ENCOUNTER — Other Ambulatory Visit: Payer: Self-pay

## 2020-04-21 DIAGNOSIS — R921 Mammographic calcification found on diagnostic imaging of breast: Secondary | ICD-10-CM | POA: Diagnosis not present

## 2020-04-21 DIAGNOSIS — R928 Other abnormal and inconclusive findings on diagnostic imaging of breast: Secondary | ICD-10-CM

## 2020-04-23 DIAGNOSIS — Z20828 Contact with and (suspected) exposure to other viral communicable diseases: Secondary | ICD-10-CM | POA: Diagnosis not present

## 2020-04-23 DIAGNOSIS — Z1159 Encounter for screening for other viral diseases: Secondary | ICD-10-CM | POA: Diagnosis not present

## 2020-04-27 DIAGNOSIS — Z1159 Encounter for screening for other viral diseases: Secondary | ICD-10-CM | POA: Diagnosis not present

## 2020-04-27 DIAGNOSIS — Z20828 Contact with and (suspected) exposure to other viral communicable diseases: Secondary | ICD-10-CM | POA: Diagnosis not present

## 2020-04-28 ENCOUNTER — Ambulatory Visit
Admission: RE | Admit: 2020-04-28 | Discharge: 2020-04-28 | Disposition: A | Discharge status: Home / Self Care | Payer: Medicare HMO | Source: Ambulatory Visit | Attending: Internal Medicine | Admitting: Internal Medicine

## 2020-04-28 ENCOUNTER — Other Ambulatory Visit: Payer: Self-pay

## 2020-04-28 DIAGNOSIS — R921 Mammographic calcification found on diagnostic imaging of breast: Secondary | ICD-10-CM

## 2020-04-28 DIAGNOSIS — N6489 Other specified disorders of breast: Secondary | ICD-10-CM | POA: Diagnosis not present

## 2020-04-28 HISTORY — PX: BREAST BIOPSY: SHX20

## 2020-04-30 DIAGNOSIS — Z20828 Contact with and (suspected) exposure to other viral communicable diseases: Secondary | ICD-10-CM | POA: Diagnosis not present

## 2020-04-30 DIAGNOSIS — Z1159 Encounter for screening for other viral diseases: Secondary | ICD-10-CM | POA: Diagnosis not present

## 2020-05-04 DIAGNOSIS — M1711 Unilateral primary osteoarthritis, right knee: Secondary | ICD-10-CM | POA: Diagnosis not present

## 2020-05-04 DIAGNOSIS — I10 Essential (primary) hypertension: Secondary | ICD-10-CM | POA: Diagnosis not present

## 2020-05-04 DIAGNOSIS — M1990 Unspecified osteoarthritis, unspecified site: Secondary | ICD-10-CM | POA: Diagnosis not present

## 2020-05-04 DIAGNOSIS — E785 Hyperlipidemia, unspecified: Secondary | ICD-10-CM | POA: Diagnosis not present

## 2020-05-04 DIAGNOSIS — K219 Gastro-esophageal reflux disease without esophagitis: Secondary | ICD-10-CM | POA: Diagnosis not present

## 2020-05-04 DIAGNOSIS — E039 Hypothyroidism, unspecified: Secondary | ICD-10-CM | POA: Diagnosis not present

## 2020-05-11 DIAGNOSIS — Z1159 Encounter for screening for other viral diseases: Secondary | ICD-10-CM | POA: Diagnosis not present

## 2020-05-11 DIAGNOSIS — Z20828 Contact with and (suspected) exposure to other viral communicable diseases: Secondary | ICD-10-CM | POA: Diagnosis not present

## 2020-05-19 DIAGNOSIS — H52223 Regular astigmatism, bilateral: Secondary | ICD-10-CM | POA: Diagnosis not present

## 2020-05-19 DIAGNOSIS — H16223 Keratoconjunctivitis sicca, not specified as Sjogren's, bilateral: Secondary | ICD-10-CM | POA: Diagnosis not present

## 2020-05-19 DIAGNOSIS — H2513 Age-related nuclear cataract, bilateral: Secondary | ICD-10-CM | POA: Diagnosis not present

## 2020-05-19 DIAGNOSIS — H25013 Cortical age-related cataract, bilateral: Secondary | ICD-10-CM | POA: Diagnosis not present

## 2020-05-19 DIAGNOSIS — H5213 Myopia, bilateral: Secondary | ICD-10-CM | POA: Diagnosis not present

## 2020-05-19 DIAGNOSIS — H524 Presbyopia: Secondary | ICD-10-CM | POA: Diagnosis not present

## 2020-05-25 DIAGNOSIS — Z1159 Encounter for screening for other viral diseases: Secondary | ICD-10-CM | POA: Diagnosis not present

## 2020-05-25 DIAGNOSIS — G4733 Obstructive sleep apnea (adult) (pediatric): Secondary | ICD-10-CM | POA: Diagnosis not present

## 2020-05-25 DIAGNOSIS — Z20828 Contact with and (suspected) exposure to other viral communicable diseases: Secondary | ICD-10-CM | POA: Diagnosis not present

## 2020-06-04 DIAGNOSIS — Z20828 Contact with and (suspected) exposure to other viral communicable diseases: Secondary | ICD-10-CM | POA: Diagnosis not present

## 2020-06-04 DIAGNOSIS — Z1159 Encounter for screening for other viral diseases: Secondary | ICD-10-CM | POA: Diagnosis not present

## 2020-06-08 DIAGNOSIS — Z20828 Contact with and (suspected) exposure to other viral communicable diseases: Secondary | ICD-10-CM | POA: Diagnosis not present

## 2020-06-08 DIAGNOSIS — Z1159 Encounter for screening for other viral diseases: Secondary | ICD-10-CM | POA: Diagnosis not present

## 2020-06-09 DIAGNOSIS — M1711 Unilateral primary osteoarthritis, right knee: Secondary | ICD-10-CM | POA: Diagnosis not present

## 2020-06-09 DIAGNOSIS — I10 Essential (primary) hypertension: Secondary | ICD-10-CM | POA: Diagnosis not present

## 2020-06-09 DIAGNOSIS — E785 Hyperlipidemia, unspecified: Secondary | ICD-10-CM | POA: Diagnosis not present

## 2020-06-09 DIAGNOSIS — M1990 Unspecified osteoarthritis, unspecified site: Secondary | ICD-10-CM | POA: Diagnosis not present

## 2020-06-09 DIAGNOSIS — K219 Gastro-esophageal reflux disease without esophagitis: Secondary | ICD-10-CM | POA: Diagnosis not present

## 2020-06-09 DIAGNOSIS — E039 Hypothyroidism, unspecified: Secondary | ICD-10-CM | POA: Diagnosis not present

## 2020-06-18 DIAGNOSIS — Z20828 Contact with and (suspected) exposure to other viral communicable diseases: Secondary | ICD-10-CM | POA: Diagnosis not present

## 2020-06-18 DIAGNOSIS — Z1159 Encounter for screening for other viral diseases: Secondary | ICD-10-CM | POA: Diagnosis not present

## 2020-06-22 DIAGNOSIS — Z20828 Contact with and (suspected) exposure to other viral communicable diseases: Secondary | ICD-10-CM | POA: Diagnosis not present

## 2020-06-22 DIAGNOSIS — Z1159 Encounter for screening for other viral diseases: Secondary | ICD-10-CM | POA: Diagnosis not present

## 2020-06-25 DIAGNOSIS — Z20828 Contact with and (suspected) exposure to other viral communicable diseases: Secondary | ICD-10-CM | POA: Diagnosis not present

## 2020-06-25 DIAGNOSIS — Z1159 Encounter for screening for other viral diseases: Secondary | ICD-10-CM | POA: Diagnosis not present

## 2020-06-30 DIAGNOSIS — E039 Hypothyroidism, unspecified: Secondary | ICD-10-CM | POA: Diagnosis not present

## 2020-06-30 DIAGNOSIS — M1711 Unilateral primary osteoarthritis, right knee: Secondary | ICD-10-CM | POA: Diagnosis not present

## 2020-06-30 DIAGNOSIS — I10 Essential (primary) hypertension: Secondary | ICD-10-CM | POA: Diagnosis not present

## 2020-06-30 DIAGNOSIS — E785 Hyperlipidemia, unspecified: Secondary | ICD-10-CM | POA: Diagnosis not present

## 2020-06-30 DIAGNOSIS — K219 Gastro-esophageal reflux disease without esophagitis: Secondary | ICD-10-CM | POA: Diagnosis not present

## 2020-06-30 DIAGNOSIS — M1990 Unspecified osteoarthritis, unspecified site: Secondary | ICD-10-CM | POA: Diagnosis not present

## 2020-07-02 DIAGNOSIS — Z1159 Encounter for screening for other viral diseases: Secondary | ICD-10-CM | POA: Diagnosis not present

## 2020-07-02 DIAGNOSIS — Z20828 Contact with and (suspected) exposure to other viral communicable diseases: Secondary | ICD-10-CM | POA: Diagnosis not present

## 2020-07-03 DIAGNOSIS — K219 Gastro-esophageal reflux disease without esophagitis: Secondary | ICD-10-CM | POA: Diagnosis not present

## 2020-07-03 DIAGNOSIS — M1990 Unspecified osteoarthritis, unspecified site: Secondary | ICD-10-CM | POA: Diagnosis not present

## 2020-07-03 DIAGNOSIS — Z0000 Encounter for general adult medical examination without abnormal findings: Secondary | ICD-10-CM | POA: Diagnosis not present

## 2020-07-03 DIAGNOSIS — E785 Hyperlipidemia, unspecified: Secondary | ICD-10-CM | POA: Diagnosis not present

## 2020-07-03 DIAGNOSIS — Z23 Encounter for immunization: Secondary | ICD-10-CM | POA: Diagnosis not present

## 2020-07-03 DIAGNOSIS — I10 Essential (primary) hypertension: Secondary | ICD-10-CM | POA: Diagnosis not present

## 2020-07-03 DIAGNOSIS — E039 Hypothyroidism, unspecified: Secondary | ICD-10-CM | POA: Diagnosis not present

## 2020-07-03 DIAGNOSIS — Z1231 Encounter for screening mammogram for malignant neoplasm of breast: Secondary | ICD-10-CM | POA: Diagnosis not present

## 2020-07-03 DIAGNOSIS — Z1389 Encounter for screening for other disorder: Secondary | ICD-10-CM | POA: Diagnosis not present

## 2020-07-03 DIAGNOSIS — M1711 Unilateral primary osteoarthritis, right knee: Secondary | ICD-10-CM | POA: Diagnosis not present

## 2020-07-06 DIAGNOSIS — Z1159 Encounter for screening for other viral diseases: Secondary | ICD-10-CM | POA: Diagnosis not present

## 2020-07-06 DIAGNOSIS — Z20828 Contact with and (suspected) exposure to other viral communicable diseases: Secondary | ICD-10-CM | POA: Diagnosis not present

## 2020-07-16 DIAGNOSIS — Z20828 Contact with and (suspected) exposure to other viral communicable diseases: Secondary | ICD-10-CM | POA: Diagnosis not present

## 2020-07-16 DIAGNOSIS — Z1159 Encounter for screening for other viral diseases: Secondary | ICD-10-CM | POA: Diagnosis not present

## 2020-07-20 DIAGNOSIS — Z20828 Contact with and (suspected) exposure to other viral communicable diseases: Secondary | ICD-10-CM | POA: Diagnosis not present

## 2020-07-20 DIAGNOSIS — Z1159 Encounter for screening for other viral diseases: Secondary | ICD-10-CM | POA: Diagnosis not present

## 2020-07-23 DIAGNOSIS — Z20828 Contact with and (suspected) exposure to other viral communicable diseases: Secondary | ICD-10-CM | POA: Diagnosis not present

## 2020-07-23 DIAGNOSIS — Z1159 Encounter for screening for other viral diseases: Secondary | ICD-10-CM | POA: Diagnosis not present

## 2020-07-30 DIAGNOSIS — E039 Hypothyroidism, unspecified: Secondary | ICD-10-CM | POA: Diagnosis not present

## 2020-07-30 DIAGNOSIS — M1711 Unilateral primary osteoarthritis, right knee: Secondary | ICD-10-CM | POA: Diagnosis not present

## 2020-07-30 DIAGNOSIS — Z20828 Contact with and (suspected) exposure to other viral communicable diseases: Secondary | ICD-10-CM | POA: Diagnosis not present

## 2020-07-30 DIAGNOSIS — E785 Hyperlipidemia, unspecified: Secondary | ICD-10-CM | POA: Diagnosis not present

## 2020-07-30 DIAGNOSIS — M1990 Unspecified osteoarthritis, unspecified site: Secondary | ICD-10-CM | POA: Diagnosis not present

## 2020-07-30 DIAGNOSIS — I10 Essential (primary) hypertension: Secondary | ICD-10-CM | POA: Diagnosis not present

## 2020-07-30 DIAGNOSIS — K219 Gastro-esophageal reflux disease without esophagitis: Secondary | ICD-10-CM | POA: Diagnosis not present

## 2020-07-30 DIAGNOSIS — Z1159 Encounter for screening for other viral diseases: Secondary | ICD-10-CM | POA: Diagnosis not present

## 2020-08-03 DIAGNOSIS — Z1159 Encounter for screening for other viral diseases: Secondary | ICD-10-CM | POA: Diagnosis not present

## 2020-08-03 DIAGNOSIS — Z20828 Contact with and (suspected) exposure to other viral communicable diseases: Secondary | ICD-10-CM | POA: Diagnosis not present

## 2020-08-13 DIAGNOSIS — Z1159 Encounter for screening for other viral diseases: Secondary | ICD-10-CM | POA: Diagnosis not present

## 2020-08-13 DIAGNOSIS — Z20828 Contact with and (suspected) exposure to other viral communicable diseases: Secondary | ICD-10-CM | POA: Diagnosis not present

## 2020-08-17 DIAGNOSIS — Z1159 Encounter for screening for other viral diseases: Secondary | ICD-10-CM | POA: Diagnosis not present

## 2020-08-17 DIAGNOSIS — Z20828 Contact with and (suspected) exposure to other viral communicable diseases: Secondary | ICD-10-CM | POA: Diagnosis not present

## 2020-08-20 DIAGNOSIS — Z1159 Encounter for screening for other viral diseases: Secondary | ICD-10-CM | POA: Diagnosis not present

## 2020-08-20 DIAGNOSIS — Z20828 Contact with and (suspected) exposure to other viral communicable diseases: Secondary | ICD-10-CM | POA: Diagnosis not present

## 2020-08-24 DIAGNOSIS — G4733 Obstructive sleep apnea (adult) (pediatric): Secondary | ICD-10-CM | POA: Diagnosis not present

## 2020-08-27 DIAGNOSIS — Z20828 Contact with and (suspected) exposure to other viral communicable diseases: Secondary | ICD-10-CM | POA: Diagnosis not present

## 2020-08-27 DIAGNOSIS — Z1159 Encounter for screening for other viral diseases: Secondary | ICD-10-CM | POA: Diagnosis not present

## 2020-08-31 DIAGNOSIS — Z20828 Contact with and (suspected) exposure to other viral communicable diseases: Secondary | ICD-10-CM | POA: Diagnosis not present

## 2020-08-31 DIAGNOSIS — Z1159 Encounter for screening for other viral diseases: Secondary | ICD-10-CM | POA: Diagnosis not present

## 2020-09-03 DIAGNOSIS — G4733 Obstructive sleep apnea (adult) (pediatric): Secondary | ICD-10-CM | POA: Diagnosis not present

## 2020-09-03 DIAGNOSIS — Z1159 Encounter for screening for other viral diseases: Secondary | ICD-10-CM | POA: Diagnosis not present

## 2020-09-03 DIAGNOSIS — Z20828 Contact with and (suspected) exposure to other viral communicable diseases: Secondary | ICD-10-CM | POA: Diagnosis not present

## 2020-09-09 DIAGNOSIS — M1990 Unspecified osteoarthritis, unspecified site: Secondary | ICD-10-CM | POA: Diagnosis not present

## 2020-09-09 DIAGNOSIS — I10 Essential (primary) hypertension: Secondary | ICD-10-CM | POA: Diagnosis not present

## 2020-09-09 DIAGNOSIS — K219 Gastro-esophageal reflux disease without esophagitis: Secondary | ICD-10-CM | POA: Diagnosis not present

## 2020-09-09 DIAGNOSIS — E785 Hyperlipidemia, unspecified: Secondary | ICD-10-CM | POA: Diagnosis not present

## 2020-09-09 DIAGNOSIS — E039 Hypothyroidism, unspecified: Secondary | ICD-10-CM | POA: Diagnosis not present

## 2020-09-09 DIAGNOSIS — M1711 Unilateral primary osteoarthritis, right knee: Secondary | ICD-10-CM | POA: Diagnosis not present

## 2020-09-10 DIAGNOSIS — Z20828 Contact with and (suspected) exposure to other viral communicable diseases: Secondary | ICD-10-CM | POA: Diagnosis not present

## 2020-09-10 DIAGNOSIS — Z1159 Encounter for screening for other viral diseases: Secondary | ICD-10-CM | POA: Diagnosis not present

## 2020-09-11 DIAGNOSIS — M1711 Unilateral primary osteoarthritis, right knee: Secondary | ICD-10-CM | POA: Diagnosis not present

## 2020-09-11 DIAGNOSIS — K219 Gastro-esophageal reflux disease without esophagitis: Secondary | ICD-10-CM | POA: Diagnosis not present

## 2020-09-11 DIAGNOSIS — M1990 Unspecified osteoarthritis, unspecified site: Secondary | ICD-10-CM | POA: Diagnosis not present

## 2020-09-11 DIAGNOSIS — E039 Hypothyroidism, unspecified: Secondary | ICD-10-CM | POA: Diagnosis not present

## 2020-09-11 DIAGNOSIS — E785 Hyperlipidemia, unspecified: Secondary | ICD-10-CM | POA: Diagnosis not present

## 2020-09-11 DIAGNOSIS — I10 Essential (primary) hypertension: Secondary | ICD-10-CM | POA: Diagnosis not present

## 2020-09-14 DIAGNOSIS — Z20828 Contact with and (suspected) exposure to other viral communicable diseases: Secondary | ICD-10-CM | POA: Diagnosis not present

## 2020-09-14 DIAGNOSIS — Z1159 Encounter for screening for other viral diseases: Secondary | ICD-10-CM | POA: Diagnosis not present

## 2020-09-17 DIAGNOSIS — Z1159 Encounter for screening for other viral diseases: Secondary | ICD-10-CM | POA: Diagnosis not present

## 2020-09-17 DIAGNOSIS — Z20828 Contact with and (suspected) exposure to other viral communicable diseases: Secondary | ICD-10-CM | POA: Diagnosis not present

## 2020-09-24 DIAGNOSIS — Z1159 Encounter for screening for other viral diseases: Secondary | ICD-10-CM | POA: Diagnosis not present

## 2020-09-24 DIAGNOSIS — Z20828 Contact with and (suspected) exposure to other viral communicable diseases: Secondary | ICD-10-CM | POA: Diagnosis not present

## 2020-09-28 DIAGNOSIS — Z1159 Encounter for screening for other viral diseases: Secondary | ICD-10-CM | POA: Diagnosis not present

## 2020-09-28 DIAGNOSIS — Z20828 Contact with and (suspected) exposure to other viral communicable diseases: Secondary | ICD-10-CM | POA: Diagnosis not present

## 2020-10-01 DIAGNOSIS — Z1159 Encounter for screening for other viral diseases: Secondary | ICD-10-CM | POA: Diagnosis not present

## 2020-10-01 DIAGNOSIS — Z20828 Contact with and (suspected) exposure to other viral communicable diseases: Secondary | ICD-10-CM | POA: Diagnosis not present

## 2020-10-08 DIAGNOSIS — Z20828 Contact with and (suspected) exposure to other viral communicable diseases: Secondary | ICD-10-CM | POA: Diagnosis not present

## 2020-10-08 DIAGNOSIS — Z1159 Encounter for screening for other viral diseases: Secondary | ICD-10-CM | POA: Diagnosis not present

## 2020-10-12 DIAGNOSIS — Z20828 Contact with and (suspected) exposure to other viral communicable diseases: Secondary | ICD-10-CM | POA: Diagnosis not present

## 2020-10-12 DIAGNOSIS — Z1159 Encounter for screening for other viral diseases: Secondary | ICD-10-CM | POA: Diagnosis not present

## 2020-10-15 DIAGNOSIS — Z20828 Contact with and (suspected) exposure to other viral communicable diseases: Secondary | ICD-10-CM | POA: Diagnosis not present

## 2020-10-15 DIAGNOSIS — Z1159 Encounter for screening for other viral diseases: Secondary | ICD-10-CM | POA: Diagnosis not present

## 2020-10-22 DIAGNOSIS — Z1159 Encounter for screening for other viral diseases: Secondary | ICD-10-CM | POA: Diagnosis not present

## 2020-10-22 DIAGNOSIS — Z20828 Contact with and (suspected) exposure to other viral communicable diseases: Secondary | ICD-10-CM | POA: Diagnosis not present

## 2020-10-26 DIAGNOSIS — Z20828 Contact with and (suspected) exposure to other viral communicable diseases: Secondary | ICD-10-CM | POA: Diagnosis not present

## 2020-10-26 DIAGNOSIS — Z1159 Encounter for screening for other viral diseases: Secondary | ICD-10-CM | POA: Diagnosis not present

## 2020-10-29 DIAGNOSIS — Z20828 Contact with and (suspected) exposure to other viral communicable diseases: Secondary | ICD-10-CM | POA: Diagnosis not present

## 2020-10-29 DIAGNOSIS — Z1159 Encounter for screening for other viral diseases: Secondary | ICD-10-CM | POA: Diagnosis not present

## 2020-11-05 DIAGNOSIS — Z20828 Contact with and (suspected) exposure to other viral communicable diseases: Secondary | ICD-10-CM | POA: Diagnosis not present

## 2020-11-05 DIAGNOSIS — Z1159 Encounter for screening for other viral diseases: Secondary | ICD-10-CM | POA: Diagnosis not present

## 2020-11-09 DIAGNOSIS — Z1159 Encounter for screening for other viral diseases: Secondary | ICD-10-CM | POA: Diagnosis not present

## 2020-11-09 DIAGNOSIS — Z20828 Contact with and (suspected) exposure to other viral communicable diseases: Secondary | ICD-10-CM | POA: Diagnosis not present

## 2020-11-10 DIAGNOSIS — K219 Gastro-esophageal reflux disease without esophagitis: Secondary | ICD-10-CM | POA: Diagnosis not present

## 2020-11-10 DIAGNOSIS — I10 Essential (primary) hypertension: Secondary | ICD-10-CM | POA: Diagnosis not present

## 2020-11-10 DIAGNOSIS — E785 Hyperlipidemia, unspecified: Secondary | ICD-10-CM | POA: Diagnosis not present

## 2020-11-10 DIAGNOSIS — M1711 Unilateral primary osteoarthritis, right knee: Secondary | ICD-10-CM | POA: Diagnosis not present

## 2020-11-10 DIAGNOSIS — M1990 Unspecified osteoarthritis, unspecified site: Secondary | ICD-10-CM | POA: Diagnosis not present

## 2020-11-10 DIAGNOSIS — E039 Hypothyroidism, unspecified: Secondary | ICD-10-CM | POA: Diagnosis not present

## 2020-11-12 DIAGNOSIS — Z20828 Contact with and (suspected) exposure to other viral communicable diseases: Secondary | ICD-10-CM | POA: Diagnosis not present

## 2020-11-12 DIAGNOSIS — Z1159 Encounter for screening for other viral diseases: Secondary | ICD-10-CM | POA: Diagnosis not present

## 2020-11-17 ENCOUNTER — Ambulatory Visit
Admission: RE | Admit: 2020-11-17 | Discharge: 2020-11-17 | Disposition: A | Discharge status: Home / Self Care | Payer: Medicare HMO | Source: Ambulatory Visit | Attending: Internal Medicine | Admitting: Internal Medicine

## 2020-11-17 ENCOUNTER — Other Ambulatory Visit: Payer: Self-pay | Admitting: Internal Medicine

## 2020-11-17 DIAGNOSIS — M19041 Primary osteoarthritis, right hand: Secondary | ICD-10-CM | POA: Diagnosis not present

## 2020-11-17 DIAGNOSIS — M19049 Primary osteoarthritis, unspecified hand: Secondary | ICD-10-CM

## 2020-11-17 DIAGNOSIS — E039 Hypothyroidism, unspecified: Secondary | ICD-10-CM | POA: Diagnosis not present

## 2020-11-17 DIAGNOSIS — M19042 Primary osteoarthritis, left hand: Secondary | ICD-10-CM | POA: Diagnosis not present

## 2020-11-19 DIAGNOSIS — Z1159 Encounter for screening for other viral diseases: Secondary | ICD-10-CM | POA: Diagnosis not present

## 2020-11-19 DIAGNOSIS — Z20828 Contact with and (suspected) exposure to other viral communicable diseases: Secondary | ICD-10-CM | POA: Diagnosis not present

## 2020-11-23 DIAGNOSIS — Z20828 Contact with and (suspected) exposure to other viral communicable diseases: Secondary | ICD-10-CM | POA: Diagnosis not present

## 2020-11-23 DIAGNOSIS — Z1159 Encounter for screening for other viral diseases: Secondary | ICD-10-CM | POA: Diagnosis not present

## 2020-11-25 DIAGNOSIS — G4733 Obstructive sleep apnea (adult) (pediatric): Secondary | ICD-10-CM | POA: Diagnosis not present

## 2020-11-26 DIAGNOSIS — Z20828 Contact with and (suspected) exposure to other viral communicable diseases: Secondary | ICD-10-CM | POA: Diagnosis not present

## 2020-11-26 DIAGNOSIS — Z1159 Encounter for screening for other viral diseases: Secondary | ICD-10-CM | POA: Diagnosis not present

## 2020-12-07 DIAGNOSIS — Z20828 Contact with and (suspected) exposure to other viral communicable diseases: Secondary | ICD-10-CM | POA: Diagnosis not present

## 2020-12-07 DIAGNOSIS — Z1159 Encounter for screening for other viral diseases: Secondary | ICD-10-CM | POA: Diagnosis not present

## 2020-12-21 DIAGNOSIS — Z1159 Encounter for screening for other viral diseases: Secondary | ICD-10-CM | POA: Diagnosis not present

## 2020-12-21 DIAGNOSIS — Z20828 Contact with and (suspected) exposure to other viral communicable diseases: Secondary | ICD-10-CM | POA: Diagnosis not present

## 2020-12-24 DIAGNOSIS — Z20828 Contact with and (suspected) exposure to other viral communicable diseases: Secondary | ICD-10-CM | POA: Diagnosis not present

## 2020-12-24 DIAGNOSIS — Z1159 Encounter for screening for other viral diseases: Secondary | ICD-10-CM | POA: Diagnosis not present

## 2020-12-29 DIAGNOSIS — E785 Hyperlipidemia, unspecified: Secondary | ICD-10-CM | POA: Diagnosis not present

## 2020-12-29 DIAGNOSIS — M1990 Unspecified osteoarthritis, unspecified site: Secondary | ICD-10-CM | POA: Diagnosis not present

## 2020-12-29 DIAGNOSIS — E039 Hypothyroidism, unspecified: Secondary | ICD-10-CM | POA: Diagnosis not present

## 2020-12-29 DIAGNOSIS — J45901 Unspecified asthma with (acute) exacerbation: Secondary | ICD-10-CM | POA: Diagnosis not present

## 2020-12-29 DIAGNOSIS — I10 Essential (primary) hypertension: Secondary | ICD-10-CM | POA: Diagnosis not present

## 2020-12-29 DIAGNOSIS — K219 Gastro-esophageal reflux disease without esophagitis: Secondary | ICD-10-CM | POA: Diagnosis not present

## 2020-12-31 DIAGNOSIS — Z1159 Encounter for screening for other viral diseases: Secondary | ICD-10-CM | POA: Diagnosis not present

## 2020-12-31 DIAGNOSIS — Z20828 Contact with and (suspected) exposure to other viral communicable diseases: Secondary | ICD-10-CM | POA: Diagnosis not present

## 2021-01-01 NOTE — Progress Notes (Signed)
Office Visit Note  Patient: Jennifer Shields             Date of Birth: 11/06/1950           MRN: [id number]             PCP: Leeroy Cha, MD Referring: Leeroy Cha,* Visit Date: 01/15/2021 Occupation: '@GUAROCC'$ @  Subjective:  New Patient (Initial Visit) (Bil knee pain, bil hand pain)   History of Present Illness: Jennifer Shields is a 70 y.o. female seen in consultation per request of her PCP.  According to the patient her symptoms a started in her 81s with bilateral knee joint pain.  She recalls seeing an orthopedic surgeon who advised surgery.  She states for the last 2 months she has been having increased pain and discomfort in her bilateral hands.  She is right-handed.  She gives history of discomfort in her wrist joints as well.  She states her right shoulder joint bothers her off and on.  She has some discomfort in her neck and lower back but it comes and goes.  She notices swelling in her hands.  There is no history of psoriasis.  There is family history of lupus in her maternal aunt.  She is gravida 3, para 3.  There is no history of DVT.  Activities of Daily Living:  Patient reports morning stiffness for 10 minutes.   Patient Reports nocturnal pain.  Difficulty dressing/grooming: Denies Difficulty climbing stairs: Reports Difficulty getting out of chair: Reports Difficulty using hands for taps, buttons, cutlery, and/or writing: Reports  Review of Systems  Constitutional:  Positive for fatigue.  HENT:  Positive for mouth dryness.   Eyes:  Positive for discharge.  Respiratory:  Negative for shortness of breath.   Cardiovascular:  Negative for swelling in legs/feet.  Gastrointestinal:  Negative for constipation.  Endocrine: Positive for cold intolerance, excessive thirst and increased urination.  Genitourinary:  Negative for difficulty urinating.  Musculoskeletal:  Positive for joint pain, gait problem, joint pain, joint swelling, muscle weakness,  morning stiffness and muscle tenderness.  Skin:  Negative for rash.  Allergic/Immunologic: Negative for susceptible to infections.  Neurological:  Positive for numbness and weakness.  Hematological:  Negative for bruising/bleeding tendency.  Psychiatric/Behavioral:  Positive for sleep disturbance.    PMFS History:  Patient Active Problem List   Diagnosis Date Noted   OSA (obstructive sleep apnea) 01/15/2021   History of hypothyroidism 01/15/2021   History of gastroesophageal reflux (GERD) 01/15/2021   Essential hypertension 01/15/2021   Vitamin D deficiency 01/15/2021    Past Medical History:  Diagnosis Date   Allergy    Arthritis    in hands,knees ,feet and legs   Hypertension    Sleep apnea    does not uses c-pap at this time   Thyroid disease     Family History  Problem Relation Age of Onset   Diabetes Sister    Lung disease Brother    Heart disease Brother    Breast cancer Neg Hx    Past Surgical History:  Procedure Laterality Date   ABDOMINAL HYSTERECTOMY     COLONOSCOPY     THYROIDECTOMY     Social History   Social History Narrative   Not on file   Immunization History  Administered Date(s) Administered   Moderna Sars-Covid-2 Vaccination 08/01/2019, 08/29/2019, 05/31/2020   Tdap 03/03/2016     Objective: Vital Signs: BP (!) 142/75 (BP Location: Right Arm, Patient Position: Sitting, Cuff Size: Normal)  Pulse 69   Resp 16   Ht 5' 4.5" (1.638 m)   Wt 192 lb (87.1 kg)   BMI 32.45 kg/m    Physical Exam Vitals and nursing note reviewed.  Constitutional:      Appearance: She is well-developed.  HENT:     Head: Normocephalic and atraumatic.  Eyes:     Conjunctiva/sclera: Conjunctivae normal.  Cardiovascular:     Rate and Rhythm: Normal rate and regular rhythm.     Heart sounds: Normal heart sounds.  Pulmonary:     Effort: Pulmonary effort is normal.     Breath sounds: Normal breath sounds.  Abdominal:     General: Bowel sounds are normal.      Palpations: Abdomen is soft.  Musculoskeletal:     Cervical back: Normal range of motion.  Lymphadenopathy:     Cervical: No cervical adenopathy.  Skin:    General: Skin is warm and dry.     Capillary Refill: Capillary refill takes less than 2 seconds.  Neurological:     Mental Status: She is alert and oriented to person, place, and time.  Psychiatric:        Behavior: Behavior normal.     Musculoskeletal Exam: She has good range of motion of her cervical spine.  She has postural thoracic kyphosis.  She has some stiffness with range of motion of her shoulders although her shoulders with good range of motion.  She had no tenderness over her elbows or synovitis.  She had good range of motion of bilateral wrist joints with no synovitis.  She had bilateral CMC PIP and DIP thickening but no synovitis was noted.  Hip joints with good range of motion.  She has some warmth on palpation of her knee joints and limited extension.  There was no tenderness or swelling over ankles.  There was no tenderness over MTPs or PIPs.  She has discoloration of several of her toes and nail dystrophy was noted.  CDAI Exam: CDAI Score: -- Patient Global: --; Provider Global: -- Swollen: --; Tender: -- Joint Exam 01/15/2021   No joint exam has been documented for this visit   There is currently no information documented on the homunculus. Go to the Rheumatology activity and complete the homunculus joint exam.  Investigation: No additional findings.  Imaging: XR Foot 2 Views Left  Result Date: 01/15/2021 First MTP narrowing, PIP and DIP narrowing was noted.  Metatarsal tarsal narrowing with dorsal spurring was noted.  No tibiotalar or subtalar joint space narrowing was noted.  Inferior calcaneal spur was noted.  No erosive changes were noted. Impression: These findings are consistent with osteoarthritis of the foot.  XR Foot 2 Views Right  Result Date: 01/15/2021 First MTP, PIP and DIP narrowing was noted.  No  intertarsal, tibiotalar or subtalar joint space narrowing was noted.  Inferior calcaneal spur was noted. Impression: These findings are consistent with osteoarthritis of the foot.  XR KNEE 3 VIEW LEFT  Result Date: 01/15/2021 Severe medial compartment narrowing was noted.  Lateral osteophytes are noted.  Severe patellofemoral narrowing was noted.  No chondrocalcinosis was noted. 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  XR KNEE 3 VIEW RIGHT  Result Date: 01/15/2021 Severe medial compartment narrowing with subluxation of the knee joint was noted.  Severe patellofemoral narrowing was noted.  No chondrocalcinosis was noted. Impression: These findings are consistent with end-stage osteoarthritis and severe chondromalacia patella.   Recent Labs: Lab Results  Component  Value Date   WBC 6.3 05/31/2016   HGB 12.6 05/31/2016   PLT 297 05/31/2016   NA 140 05/31/2016   K 3.2 (L) 05/31/2016   CL 101 05/31/2016   CO2 32 05/31/2016   GLUCOSE 111 (H) 05/31/2016   BUN 8 05/31/2016   CREATININE 0.80 05/31/2016   CALCIUM 9.7 05/31/2016   GFRAA >60 05/31/2016    Speciality Comments: No specialty comments available.  Procedures:  No procedures performed Allergies: Patient has no known allergies.   Assessment / Plan:     Visit Diagnoses: Pain in both hands - 11/17/20: x-rays of both hands: Evidence of erosive OA at right 5th digit PIP joint. -I reviewed x-rays from November 17, 2020 which showed severe osteoarthritis involving bilateral hands with erosive change in the right fifth DIP joint.  She also has severe third MCP narrowing with hanging osteophyte which raises concern about possible gout or pseudogout.  She does not give history of gout or pseudogout.  I will check following labs today.  Plan: Sedimentation rate, C-reactive protein, uric acid.  A handout on bilateral hand exercises was given..  A handout on hand exercises was given.   She is been having a lot of discomfort in the left CMC joint.  I gave her a prescription for left CMC brace.  Chronic pain of both knees -she complains of pain and discomfort in her bilateral knee joints.  She has difficulty with mobility.  Plan: XR KNEE 3 VIEW RIGHT, XR KNEE 3 VIEW LEFT.  X-rays of bilateral knees shows end-stage osteoarthritis in bilateral knee joints and severe chondromalacia patella.  X-ray findings were discussed with the patient.  She would be a good candidate for total knee replacement.  She is not interested in having total knee replacement yet.  A handout on lower extremity muscle strengthening exercises was given.  Pain in both feet -she complains of discomfort in her bilateral feet.  Plan: XR Foot 2 Views Right, XR Foot 2 Views Left.  X-rays were consistent with osteoarthritis.   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  I advised an appointment with a podiatrist.  Essential hypertension-blood pressure is mildly elevated.  She is on medications.  History of gastroesophageal reflux (GERD)-she is on Nexium.  Vitamin D deficiency-she is on vitamin D 2000 units a day.  I also reviewed her DEXA scan which was within normal limits in 2020.  History of hypothyroidism  OSA (obstructive sleep apnea) - on CPAP  Family history of systemic lupus erythematosus - Maternal aunt  Orders: Orders Placed This Encounter  Procedures   XR KNEE 3 VIEW RIGHT   XR KNEE 3 VIEW LEFT   XR Foot 2 Views Right   XR Foot 2 Views Left   Sedimentation rate   C-reactive protein    No orders of the defined types were placed in this encounter.   Follow-Up Instructions: Return for Osteoarthritis.   Bo Merino, MD  Note - This record has been created using Editor, commissioning.  Chart creation errors have been sought, but may not always  have been located. Such creation errors do not reflect on  the standard of medical care.

## 2021-01-04 DIAGNOSIS — Z20828 Contact with and (suspected) exposure to other viral communicable diseases: Secondary | ICD-10-CM | POA: Diagnosis not present

## 2021-01-04 DIAGNOSIS — Z1159 Encounter for screening for other viral diseases: Secondary | ICD-10-CM | POA: Diagnosis not present

## 2021-01-07 DIAGNOSIS — Z20828 Contact with and (suspected) exposure to other viral communicable diseases: Secondary | ICD-10-CM | POA: Diagnosis not present

## 2021-01-07 DIAGNOSIS — Z1159 Encounter for screening for other viral diseases: Secondary | ICD-10-CM | POA: Diagnosis not present

## 2021-01-14 DIAGNOSIS — Z20828 Contact with and (suspected) exposure to other viral communicable diseases: Secondary | ICD-10-CM | POA: Diagnosis not present

## 2021-01-14 DIAGNOSIS — Z1159 Encounter for screening for other viral diseases: Secondary | ICD-10-CM | POA: Diagnosis not present

## 2021-01-15 ENCOUNTER — Ambulatory Visit: Payer: Medicare HMO | Admitting: Rheumatology

## 2021-01-15 ENCOUNTER — Encounter: Payer: Self-pay | Admitting: Rheumatology

## 2021-01-15 ENCOUNTER — Ambulatory Visit: Payer: Self-pay

## 2021-01-15 ENCOUNTER — Other Ambulatory Visit: Payer: Self-pay

## 2021-01-15 VITALS — BP 142/75 | HR 69 | Resp 16 | Ht 64.5 in | Wt 192.0 lb

## 2021-01-15 DIAGNOSIS — I10 Essential (primary) hypertension: Secondary | ICD-10-CM

## 2021-01-15 DIAGNOSIS — M25562 Pain in left knee: Secondary | ICD-10-CM

## 2021-01-15 DIAGNOSIS — M79641 Pain in right hand: Secondary | ICD-10-CM

## 2021-01-15 DIAGNOSIS — M25561 Pain in right knee: Secondary | ICD-10-CM

## 2021-01-15 DIAGNOSIS — G8929 Other chronic pain: Secondary | ICD-10-CM | POA: Diagnosis not present

## 2021-01-15 DIAGNOSIS — M79672 Pain in left foot: Secondary | ICD-10-CM

## 2021-01-15 DIAGNOSIS — M79671 Pain in right foot: Secondary | ICD-10-CM

## 2021-01-15 DIAGNOSIS — G4733 Obstructive sleep apnea (adult) (pediatric): Secondary | ICD-10-CM | POA: Diagnosis not present

## 2021-01-15 DIAGNOSIS — Z8639 Personal history of other endocrine, nutritional and metabolic disease: Secondary | ICD-10-CM | POA: Diagnosis not present

## 2021-01-15 DIAGNOSIS — Z8269 Family history of other diseases of the musculoskeletal system and connective tissue: Secondary | ICD-10-CM

## 2021-01-15 DIAGNOSIS — L603 Nail dystrophy: Secondary | ICD-10-CM

## 2021-01-15 DIAGNOSIS — E049 Nontoxic goiter, unspecified: Secondary | ICD-10-CM

## 2021-01-15 DIAGNOSIS — M79642 Pain in left hand: Secondary | ICD-10-CM

## 2021-01-15 DIAGNOSIS — M19041 Primary osteoarthritis, right hand: Secondary | ICD-10-CM

## 2021-01-15 DIAGNOSIS — Z8719 Personal history of other diseases of the digestive system: Secondary | ICD-10-CM

## 2021-01-15 DIAGNOSIS — E559 Vitamin D deficiency, unspecified: Secondary | ICD-10-CM | POA: Diagnosis not present

## 2021-01-15 NOTE — Patient Instructions (Signed)
Journal for Nurse Practitioners, 15(4), 263-267. Retrieved March 19, 2018 from [email or web address].[email or web address]">  Knee Exercises Ask your health care provider which exercises are safe for you. Do exercises exactly as told by your health care provider and adjust them as directed. It is normal to feel mild stretching, pulling, tightness, or discomfort as you do these exercises. Stop right away if you feel sudden pain or your pain gets worse. Do not begin these exercises until told by your health care provider. Stretching and range-of-motion exercises These exercises warm up your muscles and joints and improve the movement and flexibility of your knee. These exercises also help to relieve pain andswelling. Knee extension, prone Lie on your abdomen (prone position) on a bed. Place your left / right knee just beyond the edge of the surface so your knee is not on the bed. You can put a towel under your left / right thigh just above your kneecap for comfort. Relax your leg muscles and allow gravity to straighten your knee (extension). You should feel a stretch behind your left / right knee. Hold this position for __________ seconds. Scoot up so your knee is supported between repetitions. Repeat __________ times. Complete this exercise __________ times a day. Knee flexion, active  Lie on your back with both legs straight. If this causes back discomfort, bend your left / right knee so your foot is flat on the floor. Slowly slide your left / right heel back toward your buttocks. Stop when you feel a gentle stretch in the front of your knee or thigh (flexion). Hold this position for __________ seconds. Slowly slide your left / right heel back to the starting position. Repeat __________ times. Complete this exercise __________ times a day. Quadriceps stretch, prone  Lie on your abdomen on a firm surface, such as a bed or padded floor. Bend your left / right knee and hold your ankle. If you cannot reach  your ankle or pant leg, loop a belt around your foot and grab the belt instead. Gently pull your heel toward your buttocks. Your knee should not slide out to the side. You should feel a stretch in the front of your thigh and knee (quadriceps). Hold this position for __________ seconds. Repeat __________ times. Complete this exercise __________ times a day. Hamstring, supine Lie on your back (supine position). Loop a belt or towel over the ball of your left / right foot. The ball of your foot is on the walking surface, right under your toes. Straighten your left / right knee and slowly pull on the belt to raise your leg until you feel a gentle stretch behind your knee (hamstring). Do not let your knee bend while you do this. Keep your other leg flat on the floor. Hold this position for __________ seconds. Repeat __________ times. Complete this exercise __________ times a day. Strengthening exercises These exercises build strength and endurance in your knee. Endurance is theability to use your muscles for a long time, even after they get tired. Quadriceps, isometric This exercise stretches the muscles in front of your thigh (quadriceps) without moving your knee joint (isometric). Lie on your back with your left / right leg extended and your other knee bent. Put a rolled towel or small pillow under your knee if told by your health care provider. Slowly tense the muscles in the front of your left / right thigh. You should see your kneecap slide up toward your hip or see increased dimpling just above the knee. This motion will   push the back of the knee toward the floor. For __________ seconds, hold the muscle as tight as you can without increasing your pain. Relax the muscles slowly and completely. Repeat __________ times. Complete this exercise __________ times a day. Straight leg raises This exercise stretches the muscles in front of your thigh (quadriceps) and the muscles that move your hips (hip  flexors). Lie on your back with your left / right leg extended and your other knee bent. Tense the muscles in the front of your left / right thigh. You should see your kneecap slide up or see increased dimpling just above the knee. Your thigh may even shake a bit. Keep these muscles tight as you raise your leg 4-6 inches (10-15 cm) off the floor. Do not let your knee bend. Hold this position for __________ seconds. Keep these muscles tense as you lower your leg. Relax your muscles slowly and completely after each repetition. Repeat __________ times. Complete this exercise __________ times a day. Hamstring, isometric Lie on your back on a firm surface. Bend your left / right knee about __________ degrees. Dig your left / right heel into the surface as if you are trying to pull it toward your buttocks. Tighten the muscles in the back of your thighs (hamstring) to "dig" as hard as you can without increasing any pain. Hold this position for __________ seconds. Release the tension gradually and allow your muscles to relax completely for __________ seconds after each repetition. Repeat __________ times. Complete this exercise __________ times a day. Hamstring curls If told by your health care provider, do this exercise while wearing ankle weights. Begin with __________ lb weights. Then increase the weight by 1 lb (0.5 kg) increments. Do not wear ankle weights that are more than __________ lb. Lie on your abdomen with your legs straight. Bend your left / right knee as far as you can without feeling pain. Keep your hips flat against the floor. Hold this position for __________ seconds. Slowly lower your leg to the starting position. Repeat __________ times. Complete this exercise __________ times a day. Squats This exercise strengthens the muscles in front of your thigh and knee (quadriceps). Stand in front of a table, with your feet and knees pointing straight ahead. You may rest your hands on the  table for balance but not for support. Slowly bend your knees and lower your hips like you are going to sit in a chair. Keep your weight over your heels, not over your toes. Keep your lower legs upright so they are parallel with the table legs. Do not let your hips go lower than your knees. Do not bend lower than told by your health care provider. If your knee pain increases, do not bend as low. Hold the squat position for __________ seconds. Slowly push with your legs to return to standing. Do not use your hands to pull yourself to standing. Repeat __________ times. Complete this exercise __________ times a day. Wall slides This exercise strengthens the muscles in front of your thigh and knee (quadriceps). Lean your back against a smooth wall or door, and walk your feet out 18-24 inches (46-61 cm) from it. Place your feet hip-width apart. Slowly slide down the wall or door until your knees bend __________ degrees. Keep your knees over your heels, not over your toes. Keep your knees in line with your hips. Hold this position for __________ seconds. Repeat __________ times. Complete this exercise __________ times a day. Straight leg raises This exercise   strengthens the muscles that rotate the leg at the hip and move it away from your body (hip abductors). Lie on your side with your left / right leg in the top position. Lie so your head, shoulder, knee, and hip line up. You may bend your bottom knee to help you keep your balance. Roll your hips slightly forward so your hips are stacked directly over each other and your left / right knee is facing forward. Leading with your heel, lift your top leg 4-6 inches (10-15 cm). You should feel the muscles in your outer hip lifting. Do not let your foot drift forward. Do not let your knee roll toward the ceiling. Hold this position for __________ seconds. Slowly return your leg to the starting position. Let your muscles relax completely after each  repetition. Repeat __________ times. Complete this exercise __________ times a day. Straight leg raises This exercise stretches the muscles that move your hips away from the front of the pelvis (hip extensors). Lie on your abdomen on a firm surface. You can put a pillow under your hips if that is more comfortable. Tense the muscles in your buttocks and lift your left / right leg about 4-6 inches (10-15 cm). Keep your knee straight as you lift your leg. Hold this position for __________ seconds. Slowly lower your leg to the starting position. Let your leg relax completely after each repetition. Repeat __________ times. Complete this exercise __________ times a day. This information is not intended to replace advice given to you by your health care provider. Make sure you discuss any questions you have with your healthcare provider. Document Revised: 03/20/2018 Document Reviewed: 03/20/2018 Else[employer] Patient Education  2022 [employer] Hand Exercises Hand exercises can be helpful for almost anyone. These exercises can strengthen the hands, improve flexibility and movement, and increase blood flow to the hands. These results can make work and daily tasks easier. Hand exercises can be especially helpful for people who have joint pain from arthritis or have nerve damage from overuse (carpal tunnel syndrome). These exercises can also help people who have injured a hand. Exercises Most of these hand exercises are gentle stretching and motion exercises. It is usually safe to do them often throughout the day. Warming up your hands before exercise may help to reduce stiffness. You can do this with gentle massage orby placing your hands in warm water for 10-15 minutes. It is normal to feel some stretching, pulling, tightness, or mild discomfort as you begin new exercises. This will gradually improve. Stop an exercise right away if you feel sudden, severe pain or your pain gets worse. Ask your healthcare  provider which exercises are best for you. Knuckle bend or "claw" fist Stand or sit with your arm, hand, and all five fingers pointed straight up. Make sure to keep your wrist straight during the exercise. Gently bend your fingers down toward your palm until the tips of your fingers are touching the top of your palm. Keep your big knuckle straight and just bend the small knuckles in your fingers. Hold this position for __________ seconds. Straighten (extend) your fingers back to the starting position. Repeat this exercise 5-10 times with each hand. Full finger fist Stand or sit with your arm, hand, and all five fingers pointed straight up. Make sure to keep your wrist straight during the exercise. Gently bend your fingers into your palm until the tips of your fingers are touching the middle of your palm. Hold this position for __________ seconds.   Extend your fingers back to the starting position, stretching every joint fully. Repeat this exercise 5-10 times with each hand. Straight fist Stand or sit with your arm, hand, and all five fingers pointed straight up. Make sure to keep your wrist straight during the exercise. Gently bend your fingers at the big knuckle, where your fingers meet your hand, and the middle knuckle. Keep the knuckle at the tips of your fingers straight and try to touch the bottom of your palm. Hold this position for __________ seconds. Extend your fingers back to the starting position, stretching every joint fully. Repeat this exercise 5-10 times with each hand. Tabletop Stand or sit with your arm, hand, and all five fingers pointed straight up. Make sure to keep your wrist straight during the exercise. Gently bend your fingers at the big knuckle, where your fingers meet your hand, as far down as you can while keeping the small knuckles in your fingers straight. Think of forming a tabletop with your fingers. Hold this position for __________ seconds. Extend your fingers  back to the starting position, stretching every joint fully. Repeat this exercise 5-10 times with each hand. Finger spread Place your hand flat on a table with your palm facing down. Make sure your wrist stays straight as you do this exercise. Spread your fingers and thumb apart from each other as far as you can until you feel a gentle stretch. Hold this position for __________ seconds. Bring your fingers and thumb tight together again. Hold this position for __________ seconds. Repeat this exercise 5-10 times with each hand. Making circles Stand or sit with your arm, hand, and all five fingers pointed straight up. Make sure to keep your wrist straight during the exercise. Make a circle by touching the tip of your thumb to the tip of your index finger. Hold for __________ seconds. Then open your hand wide. Repeat this motion with your thumb and each finger on your hand. Repeat this exercise 5-10 times with each hand. Thumb motion Sit with your forearm resting on a table and your wrist straight. Your thumb should be facing up toward the ceiling. Keep your fingers relaxed as you move your thumb. Lift your thumb up as high as you can toward the ceiling. Hold for __________ seconds. Bend your thumb across your palm as far as you can, reaching the tip of your thumb for the small finger (pinkie) side of your palm. Hold for __________ seconds. Repeat this exercise 5-10 times with each hand. Grip strengthening  Hold a stress ball or other soft ball in the middle of your hand. Slowly increase the pressure, squeezing the ball as much as you can without causing pain. Think of bringing the tips of your fingers into the middle of your palm. All of your finger joints should bend when doing this exercise. Hold your squeeze for __________ seconds, then relax. Repeat this exercise 5-10 times with each hand. Contact a health care provider if: Your hand pain or discomfort gets much worse when you do an  exercise. Your hand pain or discomfort does not improve within 2 hours after you exercise. If you have any of these problems, stop doing these exercises right away. Do not do them again unless your health care provider says that you can. Get help right away if: You develop sudden, severe hand pain or swelling. If this happens, stop doing these exercises right away. Do not do them again unless your health care provider says that you can. This   information is not intended to replace advice given to you by your health care provider. Make sure you discuss any questions you have with your healthcare provider. Document Revised: 09/20/2018 Document Reviewed: 05/31/2018 Elsevier Patient Education  2022 [employer]  

## 2021-01-16 LAB — C-REACTIVE PROTEIN: CRP: 2.7 mg/L (ref <8.0)

## 2021-01-16 LAB — URIC ACID: Uric Acid, Serum: 6 mg/dL (ref 2.5–7.0)

## 2021-01-16 LAB — SEDIMENTATION RATE: Sed Rate: 14 mm/h (ref 0–30)

## 2021-01-16 NOTE — Progress Notes (Signed)
Sed rate and C-reactive protein (inflammatory markers) are normal, uric acid is normal.

## 2021-02-05 NOTE — Progress Notes (Signed)
Office Visit Note  Patient: Jennifer Shields             Date of Birth: 03/11/1951           MRN: [id number]             PCP: Leeroy Cha, MD Referring: Leeroy Cha,* Visit Date: 02/17/2021 Occupation: '@GUAROCC'$ @  Subjective:  Pain in multiple joints.   History of Present Illness: Banita Credeur Nation is a 70 y.o. female with history of severe osteoarthritis.  She states she continues to have pain and discomfort in her bilateral hands.  She could not get the [facility] brace so far.  She also has difficulty with mobility due to pain and discomfort in the bilateral knee joints.  She has some discomfort in her feet.  She reports intermittent swelling in her hands and knees.  Activities of Daily Living:  Patient reports morning stiffness for 5-10 minutes.   Patient Reports nocturnal pain.  Difficulty dressing/grooming: Denies Difficulty climbing stairs: Reports Difficulty getting out of chair: Reports Difficulty using hands for taps, buttons, cutlery, and/or writing: Reports  Review of Systems  Constitutional:  Negative for fatigue.  HENT:  Positive for nose dryness. Negative for mouth sores and mouth dryness.   Eyes:  Positive for dryness. Negative for pain and itching.  Respiratory:  Negative for shortness of breath and difficulty breathing.   Cardiovascular:  Negative for chest pain and palpitations.  Gastrointestinal:  Negative for blood in stool, constipation and diarrhea.  Endocrine: Negative for increased urination.  Genitourinary:  Negative for difficulty urinating.  Musculoskeletal:  Positive for joint pain, joint pain, myalgias, morning stiffness, muscle tenderness and myalgias. Negative for joint swelling.  Skin:  Negative for color change, rash and redness.  Allergic/Immunologic: Negative for susceptible to infections.  Neurological:  Positive for headaches. Negative for dizziness, numbness and weakness.  Hematological:  Negative for bruising/bleeding  tendency.  Psychiatric/Behavioral:  Negative for depressed mood. The patient is not nervous/anxious.    PMFS History:  Patient Active Problem List   Diagnosis Date Noted   OSA (obstructive sleep apnea) 01/15/2021   History of hypothyroidism 01/15/2021   History of gastroesophageal reflux (GERD) 01/15/2021   Essential hypertension 01/15/2021   Vitamin D deficiency 01/15/2021    Past Medical History:  Diagnosis Date   Allergy    Arthritis    in hands,knees ,feet and legs   Hypertension    Sleep apnea    does not uses c-pap at this time   Thyroid disease     Family History  Problem Relation Age of Onset   Diabetes Sister    Lung disease Brother    Heart disease Brother    Breast cancer Neg Hx    Past Surgical History:  Procedure Laterality Date   ABDOMINAL HYSTERECTOMY     COLONOSCOPY     THYROIDECTOMY     Social History   Social History Narrative   Not on file   Immunization History  Administered Date(s) Administered   Moderna Sars-Covid-2 Vaccination 08/01/2019, 08/29/2019, 05/31/2020   Tdap 03/03/2016     Objective: Vital Signs: BP 135/82 (BP Location: Left Arm, Patient Position: Sitting, Cuff Size: Large)   Pulse 84   Ht 5' 4.5" (1.638 m)   Wt 199 lb 3.2 oz (90.4 kg)   BMI 33.66 kg/m    Physical Exam Vitals and nursing note reviewed.  Constitutional:      Appearance: She is well-developed.  HENT:     Head: Normocephalic  and atraumatic.  Eyes:     Conjunctiva/sclera: Conjunctivae normal.  Cardiovascular:     Rate and Rhythm: Normal rate and regular rhythm.     Heart sounds: Normal heart sounds.  Pulmonary:     Effort: Pulmonary effort is normal.     Breath sounds: Normal breath sounds.  Abdominal:     General: Bowel sounds are normal.     Palpations: Abdomen is soft.  Musculoskeletal:     Cervical back: Normal range of motion.  Lymphadenopathy:     Cervical: No cervical adenopathy.  Skin:    General: Skin is warm and dry.     Capillary  Refill: Capillary refill takes less than 2 seconds.  Neurological:     Mental Status: She is alert and oriented to person, place, and time.  Psychiatric:        Behavior: Behavior normal.     Musculoskeletal Exam: C-spine was in good range of motion.  Shoulder joints and elbow joints with good range of motion.  She had bilateral CMC subluxation.  Bilateral PIP and DIP thickening was noted.  Hip joints were in good range of motion.  She has difficulty with extension of her knee joints.  No warmth swelling or effusion was noted.  She had tenderness across PIPs and DIPs of her feet without any synovitis.  CDAI Exam: CDAI Score: -- Patient Global: --; Provider Global: -- Swollen: --; Tender: -- Joint Exam 02/17/2021   No joint exam has been documented for this visit   There is currently no information documented on the homunculus. Go to the Rheumatology activity and complete the homunculus joint exam.  Investigation: No additional findings.  Imaging: No results found.  Recent Labs: Lab Results  Component Value Date   WBC 6.3 05/31/2016   HGB 12.6 05/31/2016   PLT 297 05/31/2016   NA 140 05/31/2016   K 3.2 (L) 05/31/2016   CL 101 05/31/2016   CO2 32 05/31/2016   GLUCOSE 111 (H) 05/31/2016   BUN 8 05/31/2016   CREATININE 0.80 05/31/2016   CALCIUM 9.7 05/31/2016   GFRAA >60 05/31/2016   January 15, 2021 sed rate 14, CRP 2.7, uric acid 6.0  Speciality Comments: No specialty comments available.  Procedures:  Large Joint Inj on 02/17/2021 8:42 AM Indications: pain Details: 27 G 1.5 in medial  Arthrogram: No  Medications: 40 mg triamcinolone acetonide 40 MG/ML; 1.5 mL lidocaine 1 % Aspirate: 0 mL Outcome: tolerated well, no immediate complications   Allergies: Patient has no known allergies.   Assessment / Plan:     Visit Diagnoses: Primary osteoarthritis of both hands - Findings are consistent with severe osteoarthritis.  X-ray showed severe erosive OA.  Left third MCP  narrowing was also noted.  Prescription for left CMC brace was given at the last visit.  Patient has not brought the brace yet.  Joint protection and muscle strengthening was discussed.  A handout on hand exercises was given.  Primary osteoarthritis of both knees - Severe end-stage osteoarthritis of bilateral knee joints and severe chondromalacia patella.  X-ray findings were discussed with patient at length.  She has difficulty with mobility.  I had a detailed discussion with the patient regarding total knee replacement and improvement in quality of life after the total knee replacement.  Patient is somewhat convinced to move forward with a total knee replacement but she would like to discuss this further with an orthopedic surgeon.  She was having a lot of pain in her right  knee joint after informed consent was obtained right knee joint was injected with cortisone as described above.  She tolerated the procedure well.  Postprocedure instructions were given.  I also gave her a handout on lower extremity muscle strengthening exercises which will be helpful postoperatively.  Primary osteoarthritis of both feet-x-rays were consistent with osteoarthritis.  X-ray findings were discussed with the patient.  Proper fitting shoes were discussed.  Nail dystrophy - She was advised appointment with a podiatrist.  Vitamin D deficiency - CystShe is on vitamin D 2000 units daily.  DEXA scan  was normal in 2020.  Essential hypertension-blood pressure is normal today.  History of gastroesophageal reflux (GERD)-she has gastroesophageal reflux and she is on a dose meloxicam.  History of hypothyroidism  OSA (obstructive sleep apnea)  Family history of systemic lupus erythematosus - Maternal aunt  Orders: Orders Placed This Encounter  Procedures   Large Joint Inj   Ambulatory referral to Orthopedics    No orders of the defined types were placed in this encounter.    Follow-Up Instructions: Return in about  3 months (around 05/19/2021) for Osteoarthritis.   Bo Merino, MD  Note - This record has been created using Editor, commissioning.  Chart creation errors have been sought, but may not always  have been located. Such creation errors do not reflect on  the standard of medical care.

## 2021-02-17 ENCOUNTER — Other Ambulatory Visit: Payer: Self-pay

## 2021-02-17 ENCOUNTER — Encounter: Payer: Self-pay | Admitting: Rheumatology

## 2021-02-17 ENCOUNTER — Ambulatory Visit: Payer: Medicare HMO | Admitting: Rheumatology

## 2021-02-17 VITALS — BP 135/82 | HR 84 | Ht 64.5 in | Wt 199.2 lb

## 2021-02-17 DIAGNOSIS — G4733 Obstructive sleep apnea (adult) (pediatric): Secondary | ICD-10-CM

## 2021-02-17 DIAGNOSIS — Z8719 Personal history of other diseases of the digestive system: Secondary | ICD-10-CM

## 2021-02-17 DIAGNOSIS — M19071 Primary osteoarthritis, right ankle and foot: Secondary | ICD-10-CM | POA: Diagnosis not present

## 2021-02-17 DIAGNOSIS — L603 Nail dystrophy: Secondary | ICD-10-CM | POA: Diagnosis not present

## 2021-02-17 DIAGNOSIS — Z8639 Personal history of other endocrine, nutritional and metabolic disease: Secondary | ICD-10-CM

## 2021-02-17 DIAGNOSIS — M170 Bilateral primary osteoarthritis of knee: Secondary | ICD-10-CM | POA: Diagnosis not present

## 2021-02-17 DIAGNOSIS — I10 Essential (primary) hypertension: Secondary | ICD-10-CM | POA: Diagnosis not present

## 2021-02-17 DIAGNOSIS — Z8269 Family history of other diseases of the musculoskeletal system and connective tissue: Secondary | ICD-10-CM

## 2021-02-17 DIAGNOSIS — M19072 Primary osteoarthritis, left ankle and foot: Secondary | ICD-10-CM

## 2021-02-17 DIAGNOSIS — M19041 Primary osteoarthritis, right hand: Secondary | ICD-10-CM | POA: Diagnosis not present

## 2021-02-17 DIAGNOSIS — E559 Vitamin D deficiency, unspecified: Secondary | ICD-10-CM

## 2021-02-17 DIAGNOSIS — M19042 Primary osteoarthritis, left hand: Secondary | ICD-10-CM

## 2021-02-17 MED ORDER — LIDOCAINE HCL 1 % IJ SOLN
1.5000 mL | INTRAMUSCULAR | Status: AC | PRN
  Administered 2021-02-17: 1.5 mL

## 2021-02-17 MED ORDER — TRIAMCINOLONE ACETONIDE 40 MG/ML IJ SUSP
40.0000 mg | INTRAMUSCULAR | Status: AC | PRN
  Administered 2021-02-17: 40 mg via INTRA_ARTICULAR

## 2021-02-17 NOTE — Patient Instructions (Signed)
Knee Exercises Ask your health care provider which exercises are safe for you. Do exercises exactly as told by your health care provider and adjust them as directed. It is normal to feel mild stretching, pulling, tightness, or discomfort as you do these exercises. Stop right away if you feel sudden pain or your pain gets worse. Do not begin these exercises until told by your health care provider. Stretching and range-of-motion exercises These exercises warm up your muscles and joints and improve the movement and flexibility of your knee. These exercises also help to relieve pain and swelling. Knee extension, prone Lie on your abdomen (prone position) on a bed. Place your left / right knee just beyond the edge of the surface so your knee is not on the bed. You can put a towel under your left / right thigh just above your kneecap for comfort. Relax your leg muscles and allow gravity to straighten your knee (extension). You should feel a stretch behind your left / right knee. Hold this position for __________ seconds. Scoot up so your knee is supported between repetitions. Repeat __________ times. Complete this exercise __________ times a day. Knee flexion, active  Lie on your back with both legs straight. If this causes back discomfort, bend your left / right knee so your foot is flat on the floor. Slowly slide your left / right heel back toward your buttocks. Stop when you feel a gentle stretch in the front of your knee or thigh (flexion). Hold this position for __________ seconds. Slowly slide your left / right heel back to the starting position. Repeat __________ times. Complete this exercise __________ times a day. Quadriceps stretch, prone  Lie on your abdomen on a firm surface, such as a bed or padded floor. Bend your left / right knee and hold your ankle. If you cannot reach your ankle or pant leg, loop a belt around your foot and grab the belt instead. Gently pull your heel toward your  buttocks. Your knee should not slide out to the side. You should feel a stretch in the front of your thigh and knee (quadriceps). Hold this position for __________ seconds. Repeat __________ times. Complete this exercise __________ times a day. Hamstring, supine Lie on your back (supine position). Loop a belt or towel over the ball of your left / right foot. The ball of your foot is on the walking surface, right under your toes. Straighten your left / right knee and slowly pull on the belt to raise your leg until you feel a gentle stretch behind your knee (hamstring). Do not let your knee bend while you do this. Keep your other leg flat on the floor. Hold this position for __________ seconds. Repeat __________ times. Complete this exercise __________ times a day. Strengthening exercises These exercises build strength and endurance in your knee. Endurance is the ability to use your muscles for a long time, even after they get tired. Quadriceps, isometric This exercise stretches the muscles in front of your thigh (quadriceps) without moving your knee joint (isometric). Lie on your back with your left / right leg extended and your other knee bent. Put a rolled towel or small pillow under your knee if told by your health care provider. Slowly tense the muscles in the front of your left / right thigh. You should see your kneecap slide up toward your hip or see increased dimpling just above the knee. This motion will push the back of the knee toward the floor. For __________   seconds, hold the muscle as tight as you can without increasing your pain. Relax the muscles slowly and completely. Repeat __________ times. Complete this exercise __________ times a day. Straight leg raises This exercise stretches the muscles in front of your thigh (quadriceps) and the muscles that move your hips (hip flexors). Lie on your back with your left / right leg extended and your other knee bent. Tense the muscles in  the front of your left / right thigh. You should see your kneecap slide up or see increased dimpling just above the knee. Your thigh may even shake a bit. Keep these muscles tight as you raise your leg 4-6 inches (10-15 cm) off the floor. Do not let your knee bend. Hold this position for __________ seconds. Keep these muscles tense as you lower your leg. Relax your muscles slowly and completely after each repetition. Repeat __________ times. Complete this exercise __________ times a day. Hamstring, isometric Lie on your back on a firm surface. Bend your left / right knee about __________ degrees. Dig your left / right heel into the surface as if you are trying to pull it toward your buttocks. Tighten the muscles in the back of your thighs (hamstring) to "dig" as hard as you can without increasing any pain. Hold this position for __________ seconds. Release the tension gradually and allow your muscles to relax completely for __________ seconds after each repetition. Repeat __________ times. Complete this exercise __________ times a day. Hamstring curls If told by your health care provider, do this exercise while wearing ankle weights. Begin with __________ lb weights. Then increase the weight by 1 lb (0.5 kg) increments. Do not wear ankle weights that are more than __________ lb. Lie on your abdomen with your legs straight. Bend your left / right knee as far as you can without feeling pain. Keep your hips flat against the floor. Hold this position for __________ seconds. Slowly lower your leg to the starting position. Repeat __________ times. Complete this exercise __________ times a day. Squats This exercise strengthens the muscles in front of your thigh and knee (quadriceps). Stand in front of a table, with your feet and knees pointing straight ahead. You may rest your hands on the table for balance but not for support. Slowly bend your knees and lower your hips like you are going to sit in a  chair. Keep your weight over your heels, not over your toes. Keep your lower legs upright so they are parallel with the table legs. Do not let your hips go lower than your knees. Do not bend lower than told by your health care provider. If your knee pain increases, do not bend as low. Hold the squat position for __________ seconds. Slowly push with your legs to return to standing. Do not use your hands to pull yourself to standing. Repeat __________ times. Complete this exercise __________ times a day. Wall slides This exercise strengthens the muscles in front of your thigh and knee (quadriceps). Lean your back against a smooth wall or door, and walk your feet out 18-24 inches (46-61 cm) from it. Place your feet hip-width apart. Slowly slide down the wall or door until your knees bend __________ degrees. Keep your knees over your heels, not over your toes. Keep your knees in line with your hips. Hold this position for __________ seconds. Repeat __________ times. Complete this exercise __________ times a day. Straight leg raises This exercise strengthens the muscles that rotate the leg at the hip and   move it away from your body (hip abductors). Lie on your side with your left / right leg in the top position. Lie so your head, shoulder, knee, and hip line up. You may bend your bottom knee to help you keep your balance. Roll your hips slightly forward so your hips are stacked directly over each other and your left / right knee is facing forward. Leading with your heel, lift your top leg 4-6 inches (10-15 cm). You should feel the muscles in your outer hip lifting. Do not let your foot drift forward. Do not let your knee roll toward the ceiling. Hold this position for __________ seconds. Slowly return your leg to the starting position. Let your muscles relax completely after each repetition. Repeat __________ times. Complete this exercise __________ times a day. Straight leg raises This  exercise stretches the muscles that move your hips away from the front of the pelvis (hip extensors). Lie on your abdomen on a firm surface. You can put a pillow under your hips if that is more comfortable. Tense the muscles in your buttocks and lift your left / right leg about 4-6 inches (10-15 cm). Keep your knee straight as you lift your leg. Hold this position for __________ seconds. Slowly lower your leg to the starting position. Let your leg relax completely after each repetition. Repeat __________ times. Complete this exercise __________ times a day. This information is not intended to replace advice given to you by your health care provider. Make sure you discuss any questions you have with your health care provider. Document Revised: 03/20/2018 Document Reviewed: 03/20/2018 Elsevier Patient Education  [place] Exercises Hand exercises can be helpful for almost anyone. These exercises can strengthen the hands, improve flexibility and movement, and increase blood flow to the hands. These results can make work and daily tasks easier. Hand exercises can be especially helpful for people who have joint pain from arthritis or have nerve damage from overuse (carpal tunnel syndrome). These exercises can also help people who have injured a hand. Exercises Most of these hand exercises are gentle stretching and motion exercises. It is usually safe to do them often throughout the day. Warming up your hands before exercise may help to reduce stiffness. You can do this with gentle massage or by placing your hands in warm water for 10-15 minutes. It is normal to feel some stretching, pulling, tightness, or mild discomfort as you begin new exercises. This will gradually improve. Stop an exercise right away if you feel sudden, severe pain or your pain gets worse. Ask your health care provider which exercises are best for you. Knuckle bend or "claw" fist  Stand or sit with your arm, hand,  and all five fingers pointed straight up. Make sure to keep your wrist straight during the exercise. Gently bend your fingers down toward your palm until the tips of your fingers are touching the top of your palm. Keep your big knuckle straight and just bend the small knuckles in your fingers. Hold this position for __________ seconds. Straighten (extend) your fingers back to the starting position. Repeat this exercise 5-10 times with each hand. Full finger fist  Stand or sit with your arm, hand, and all five fingers pointed straight up. Make sure to keep your wrist straight during the exercise. Gently bend your fingers into your palm until the tips of your fingers are touching the middle of your palm. Hold this position for __________ seconds. Extend your fingers back to the  starting position, stretching every joint fully. Repeat this exercise 5-10 times with each hand. Straight fist Stand or sit with your arm, hand, and all five fingers pointed straight up. Make sure to keep your wrist straight during the exercise. Gently bend your fingers at the big knuckle, where your fingers meet your hand, and the middle knuckle. Keep the knuckle at the tips of your fingers straight and try to touch the bottom of your palm. Hold this position for __________ seconds. Extend your fingers back to the starting position, stretching every joint fully. Repeat this exercise 5-10 times with each hand. Tabletop  Stand or sit with your arm, hand, and all five fingers pointed straight up. Make sure to keep your wrist straight during the exercise. Gently bend your fingers at the big knuckle, where your fingers meet your hand, as far down as you can while keeping the small knuckles in your fingers straight. Think of forming a tabletop with your fingers. Hold this position for __________ seconds. Extend your fingers back to the starting position, stretching every joint fully. Repeat this exercise 5-10 times with each  hand. Finger spread  Place your hand flat on a table with your palm facing down. Make sure your wrist stays straight as you do this exercise. Spread your fingers and thumb apart from each other as far as you can until you feel a gentle stretch. Hold this position for __________ seconds. Bring your fingers and thumb tight together again. Hold this position for __________ seconds. Repeat this exercise 5-10 times with each hand. Making circles  Stand or sit with your arm, hand, and all five fingers pointed straight up. Make sure to keep your wrist straight during the exercise. Make a circle by touching the tip of your thumb to the tip of your index finger. Hold for __________ seconds. Then open your hand wide. Repeat this motion with your thumb and each finger on your hand. Repeat this exercise 5-10 times with each hand. Thumb motion  Sit with your forearm resting on a table and your wrist straight. Your thumb should be facing up toward the ceiling. Keep your fingers relaxed as you move your thumb. Lift your thumb up as high as you can toward the ceiling. Hold for __________ seconds. Bend your thumb across your palm as far as you can, reaching the tip of your thumb for the small finger (pinkie) side of your palm. Hold for __________ seconds. Repeat this exercise 5-10 times with each hand. Grip strengthening  Hold a stress ball or other soft ball in the middle of your hand. Slowly increase the pressure, squeezing the ball as much as you can without causing pain. Think of bringing the tips of your fingers into the middle of your palm. All of your finger joints should bend when doing this exercise. Hold your squeeze for __________ seconds, then relax. Repeat this exercise 5-10 times with each hand. Contact a health care provider if: Your hand pain or discomfort gets much worse when you do an exercise. Your hand pain or discomfort does not improve within 2 hours after you exercise. If you have  any of these problems, stop doing these exercises right away. Do not do them again unless your health care provider says that you can. Get help right away if: You develop sudden, severe hand pain or swelling. If this happens, stop doing these exercises right away. Do not do them again unless your health care provider says that you can. This information is  not intended to replace advice given to you by your health care provider. Make sure you discuss any questions you have with your health care provider. Document Revised: 09/17/2020 Document Reviewed: 09/17/2020 Elsevier Patient Education  [place].

## 2021-02-25 ENCOUNTER — Encounter: Payer: Self-pay | Admitting: Orthopaedic Surgery

## 2021-02-25 ENCOUNTER — Ambulatory Visit (INDEPENDENT_AMBULATORY_CARE_PROVIDER_SITE_OTHER): Payer: Medicare HMO | Admitting: Orthopaedic Surgery

## 2021-02-25 ENCOUNTER — Other Ambulatory Visit: Payer: Self-pay

## 2021-02-25 VITALS — Ht 64.0 in | Wt 193.0 lb

## 2021-02-25 DIAGNOSIS — M170 Bilateral primary osteoarthritis of knee: Secondary | ICD-10-CM

## 2021-02-25 DIAGNOSIS — M1712 Unilateral primary osteoarthritis, left knee: Secondary | ICD-10-CM | POA: Diagnosis not present

## 2021-02-25 DIAGNOSIS — M1711 Unilateral primary osteoarthritis, right knee: Secondary | ICD-10-CM | POA: Diagnosis not present

## 2021-02-25 NOTE — Progress Notes (Signed)
Office Visit Note   Patient: Jennifer Shields           Date of Birth: April 11, 1951           MRN: [id number] Visit Date: 02/25/2021              Requested by: Bo Merino, MD [address] [place],   [id number] PCP: Leeroy Cha, MD   Assessment & Plan: Visit Diagnoses:  1. Primary osteoarthritis of right knee   2. Primary osteoarthritis of left knee     Plan: Impression is end-stage bilateral knee DJD worse on the right.  At this point nonsurgical treatments no longer provide [facility] [facility] [employer] with any meaningful relief and she is now unable to perform ADLs without severe pain.  Unable to sleep at night due to chronic pain.  She has elected to move forward with scheduling for right total knee replacement as soon as possible.  Risk benefits rehab recovery time out of work reviewed with the patient.  Denies history of nickel allergy or DVT.  We will obtain preoperative clearance prior to scheduling surgery.  Questions encouraged and answered.  Follow-Up Instructions: No follow-ups on file.   Orders:  No orders of the defined types were placed in this encounter.  No orders of the defined types were placed in this encounter.     Procedures: No procedures performed   Clinical Data: No additional findings.   Subjective: Chief Complaint  Patient presents with   Left Knee - Pain   Right Knee - Pain    Jennifer Shields is a very pleasant 70 year old female referral from Dr. Estanislado Pandy her rheumatologist for bilateral knee pain secondary to DJD.  This has been worsening over the last few years.  Dr. Colon Flattery has helped treat the pain with medications and injections but at this point those treatments are no longer effective.  She was referred to [place] for surgical consultation.  Jennifer Shields is still very active and working.   Review of Systems  Constitutional: Negative.   HENT: Negative.    Eyes: Negative.   Respiratory: Negative.    Cardiovascular: Negative.    Endocrine: Negative.   Musculoskeletal: Negative.   Neurological: Negative.   Hematological: Negative.   Psychiatric/Behavioral: Negative.    All other systems reviewed and are negative.   Objective: Vital Signs: Ht '5\' 4"'$  (1.626 m)   Wt 193 lb (87.5 kg)   BMI 33.13 kg/m   Physical Exam Vitals and nursing note reviewed.  Constitutional:      Appearance: She is well-developed.  HENT:     Head: Normocephalic and atraumatic.  Pulmonary:     Effort: Pulmonary effort is normal.  Abdominal:     Palpations: Abdomen is soft.  Musculoskeletal:     Cervical back: Neck supple.  Skin:    General: Skin is warm.     Capillary Refill: Capillary refill takes less than 2 seconds.  Neurological:     Mental Status: She is alert and oriented to person, place, and time.  Psychiatric:        Behavior: Behavior normal.        Thought Content: Thought content normal.        Judgment: Judgment normal.    Ortho Exam  Right knee shows small effusion.  Painful range of motion 0 to 95 degrees.  Collaterals and cruciates are stable.  Varus deformity.  Lots of crepitus with range of motion.  Left knee shows small effusion.  Painful range of  motion 0 200 degrees.  Causing cruciates are stable.  Slight varus deformity.  Lots of crepitus with range of motion.  Specialty Comments:  No specialty comments available.  Imaging: No results found.   PMFS History: Patient Active Problem List   Diagnosis Date Noted   OSA (obstructive sleep apnea) 01/15/2021   History of hypothyroidism 01/15/2021   History of gastroesophageal reflux (GERD) 01/15/2021   Essential hypertension 01/15/2021   Vitamin D deficiency 01/15/2021   Past Medical History:  Diagnosis Date   Allergy    Arthritis    in hands,knees ,feet and legs   Hypertension    Sleep apnea    does not uses c-pap at this time   Thyroid disease     Family History  Problem Relation Age of Onset   Diabetes Sister    Lung disease Brother     Heart disease Brother    Breast cancer Neg Hx     Past Surgical History:  Procedure Laterality Date   ABDOMINAL HYSTERECTOMY     COLONOSCOPY     THYROIDECTOMY     Social History   Occupational History   Not on file  Tobacco Use   Smoking status: Never   Smokeless tobacco: Never  Vaping Use   Vaping Use: Never used  Substance and Sexual Activity   Alcohol use: No    Alcohol/week: 0.0 standard drinks   Drug use: No   Sexual activity: Not on file

## 2021-02-26 DIAGNOSIS — G4733 Obstructive sleep apnea (adult) (pediatric): Secondary | ICD-10-CM | POA: Diagnosis not present

## 2021-03-02 ENCOUNTER — Other Ambulatory Visit: Payer: Self-pay | Admitting: Internal Medicine

## 2021-03-02 DIAGNOSIS — Z1231 Encounter for screening mammogram for malignant neoplasm of breast: Secondary | ICD-10-CM

## 2021-03-11 DIAGNOSIS — E785 Hyperlipidemia, unspecified: Secondary | ICD-10-CM | POA: Diagnosis not present

## 2021-03-11 DIAGNOSIS — Z01818 Encounter for other preprocedural examination: Secondary | ICD-10-CM | POA: Diagnosis not present

## 2021-03-11 DIAGNOSIS — I10 Essential (primary) hypertension: Secondary | ICD-10-CM | POA: Diagnosis not present

## 2021-03-11 DIAGNOSIS — M25569 Pain in unspecified knee: Secondary | ICD-10-CM | POA: Diagnosis not present

## 2021-03-11 DIAGNOSIS — E669 Obesity, unspecified: Secondary | ICD-10-CM | POA: Diagnosis not present

## 2021-03-11 DIAGNOSIS — M1711 Unilateral primary osteoarthritis, right knee: Secondary | ICD-10-CM | POA: Diagnosis not present

## 2021-03-11 DIAGNOSIS — Z23 Encounter for immunization: Secondary | ICD-10-CM | POA: Diagnosis not present

## 2021-03-11 DIAGNOSIS — E039 Hypothyroidism, unspecified: Secondary | ICD-10-CM | POA: Diagnosis not present

## 2021-03-24 ENCOUNTER — Telehealth: Payer: Self-pay | Admitting: Orthopaedic Surgery

## 2021-03-24 NOTE — Telephone Encounter (Signed)
FYI:  Order for right total hip surgery received.  Called patient to discuss a date.  She asked [place] to hold 05-03-21 as a tentative date.  Clearance was sent yesterday to Dr. Fara Olden as requested.  Patient called back today and asked to cancel plans for hip surgery with Dr. Erlinda Hong in November.  Patient would like to follow up with Dr. Estanislado Pandy for another injection. Appointment noted in [place] for 05-19-21 with Hazel Sams, PA-C.

## 2021-04-06 ENCOUNTER — Ambulatory Visit
Admission: RE | Admit: 2021-04-06 | Discharge: 2021-04-06 | Disposition: A | Discharge status: Home / Self Care | Payer: Medicare HMO | Source: Ambulatory Visit | Attending: Internal Medicine | Admitting: Internal Medicine

## 2021-04-06 ENCOUNTER — Other Ambulatory Visit: Payer: Self-pay

## 2021-04-06 DIAGNOSIS — Z1231 Encounter for screening mammogram for malignant neoplasm of breast: Secondary | ICD-10-CM

## 2021-04-22 DIAGNOSIS — E785 Hyperlipidemia, unspecified: Secondary | ICD-10-CM | POA: Diagnosis not present

## 2021-04-22 DIAGNOSIS — E039 Hypothyroidism, unspecified: Secondary | ICD-10-CM | POA: Diagnosis not present

## 2021-04-22 DIAGNOSIS — M1990 Unspecified osteoarthritis, unspecified site: Secondary | ICD-10-CM | POA: Diagnosis not present

## 2021-04-22 DIAGNOSIS — K219 Gastro-esophageal reflux disease without esophagitis: Secondary | ICD-10-CM | POA: Diagnosis not present

## 2021-04-22 DIAGNOSIS — J45901 Unspecified asthma with (acute) exacerbation: Secondary | ICD-10-CM | POA: Diagnosis not present

## 2021-04-22 DIAGNOSIS — I10 Essential (primary) hypertension: Secondary | ICD-10-CM | POA: Diagnosis not present

## 2021-04-22 DIAGNOSIS — M1711 Unilateral primary osteoarthritis, right knee: Secondary | ICD-10-CM | POA: Diagnosis not present

## 2021-05-05 NOTE — Progress Notes (Signed)
Office Visit Note  Patient: Jennifer Shields             Date of Birth: [birth year]           MRN: [id number]             PCP: Leeroy Cha, MD Referring: Leeroy Cha,* Visit Date: 05/19/2021 Occupation: @GUAROCC @  Subjective:  Left knee joint pain  History of Present Illness: Lashane Whelpley Boza is a 70 y.o. female with history of osteoarthritis.  She presents today with ongoing pain in both hands and both knee joints.  She has been wearing a left CMC joint brace as encouraged at her last office visit which has been alleviating some of her discomfort.  She has been having increased pain in the right thumb so she would like to get a right CMC joint brace in the future.  She states that her right knee joint pain improved after having a cortisone injection performed by 02/17/2021.  She has had some increased discomfort in the left knee joint and requested a left knee joint cortisone injection today.  She has been using tiger balm and biofreeze topically as needed for pain relief.    Activities of Daily Living:  Patient reports morning stiffness for 10-15 minutes.   Patient Reports nocturnal pain.  Difficulty dressing/grooming: Denies Difficulty climbing stairs: Reports Difficulty getting out of chair: Reports Difficulty using hands for taps, buttons, cutlery, and/or writing: Reports  Review of Systems  Constitutional:  Negative for fatigue.  HENT:  Positive for mouth dryness and nose dryness. Negative for mouth sores.   Eyes:  Positive for dryness. Negative for pain and itching.  Respiratory:  Negative for shortness of breath and difficulty breathing.   Cardiovascular:  Negative for chest pain and palpitations.  Gastrointestinal:  Negative for blood in stool, constipation and diarrhea.  Endocrine: Negative for increased urination.  Genitourinary:  Positive for involuntary urination. Negative for difficulty urinating.  Musculoskeletal:  Positive for joint pain, joint  pain, myalgias, morning stiffness, muscle tenderness and myalgias. Negative for joint swelling.  Skin:  Negative for color change, rash and redness.  Allergic/Immunologic: Negative for susceptible to infections.  Neurological:  Positive for memory loss. Negative for dizziness, numbness, headaches and weakness.  Hematological:  Negative for bruising/bleeding tendency.  Psychiatric/Behavioral:  Negative for confusion.    PMFS History:  Patient Active Problem List   Diagnosis Date Noted   OSA (obstructive sleep apnea) 01/15/2021   History of hypothyroidism 01/15/2021   History of gastroesophageal reflux (GERD) 01/15/2021   Essential hypertension 01/15/2021   Vitamin D deficiency 01/15/2021    Past Medical History:  Diagnosis Date   Allergy    Arthritis    in hands,knees ,feet and legs   Hypertension    Sleep apnea    does not uses c-pap at this time   Thyroid disease     Family History  Problem Relation Age of Onset   Diabetes Sister    Lung disease Brother    Heart disease Brother    Breast cancer Neg Hx    Past Surgical History:  Procedure Laterality Date   ABDOMINAL HYSTERECTOMY     COLONOSCOPY     THYROIDECTOMY     Social History   Social History Narrative   Not on file   Immunization History  Administered Date(s) Administered   Moderna Sars-Covid-2 Vaccination 08/01/2019, 08/29/2019, 05/31/2020   Tdap 03/03/2016     Objective: Vital Signs: BP 133/81 (BP Location: Left Arm, Patient  Position: Sitting, Cuff Size: Large)   Pulse 80   Ht 5\' 4"  (1.626 m)   Wt 197 lb 12.8 oz (89.7 kg)   BMI 33.95 kg/m    Physical Exam Vitals and nursing note reviewed.  Constitutional:      Appearance: She is well-developed.  HENT:     Head: Normocephalic and atraumatic.  Eyes:     Conjunctiva/sclera: Conjunctivae normal.  Pulmonary:     Effort: Pulmonary effort is normal.  Abdominal:     Palpations: Abdomen is soft.  Musculoskeletal:     Cervical back: Normal range of  motion.  Skin:    General: Skin is warm and dry.     Capillary Refill: Capillary refill takes less than 2 seconds.  Neurological:     Mental Status: She is alert and oriented to person, place, and time.  Psychiatric:        Behavior: Behavior normal.     Musculoskeletal Exam: C-spine has limited ROM with lateral rotation.  Postural thoracic kyphosis noted. Shoulder joints have good ROM with some discomfort in the right shoulder with internal rotation.  Elbow joints and wrist joints have good ROM with no discomfort.  CMC joint thickening and tenderness bilaterally.  PIP and DIP thickening consistent with OA of both hands. Hip joints have good ROM with no groin pain.  Painful ROM of both knee joints with warmth but no effusion.  Ankle joints have good ROM with no tenderness or joint swelling.   CDAI Exam: CDAI Score: -- Patient Global: --; Provider Global: -- Swollen: --; Tender: -- Joint Exam 05/19/2021   No joint exam has been documented for this visit   There is currently no information documented on the homunculus. Go to the Rheumatology activity and complete the homunculus joint exam.  Investigation: No additional findings.  Imaging: No results found.  Recent Labs: Lab Results  Component Value Date   WBC 6.3 05/31/2016   HGB 12.6 05/31/2016   PLT 297 05/31/2016   NA 140 05/31/2016   K 3.2 (L) 05/31/2016   CL 101 05/31/2016   CO2 32 05/31/2016   GLUCOSE 111 (H) 05/31/2016   BUN 8 05/31/2016   CREATININE 0.80 05/31/2016   CALCIUM 9.7 05/31/2016   GFRAA >60 05/31/2016    Speciality Comments: No specialty comments available.  Procedures:  Large Joint Inj: L knee on 05/19/2021 9:31 AM Indications: pain Details: 27 G 1.5 in needle, medial approach  Arthrogram: No  Medications: 1.5 mL lidocaine 1 %; 40 mg triamcinolone acetonide 40 MG/ML Aspirate: 0 mL Outcome: tolerated well, no immediate complications Procedure, treatment alternatives, risks and benefits  explained, specific risks discussed. Consent was given by the patient. Immediately prior to procedure a time out was called to verify the correct patient, procedure, equipment, support staff and site/side marked as required. Patient was prepped and draped in the usual sterile fashion.    Allergies: Patient has no known allergies.   Assessment / Plan:     Visit Diagnoses: Primary osteoarthritis of both hands - X-ray showed severe erosive OA on 11/17/2020.  She has severe PIP and DIP thickening consistent with osteoarthritis of both hands.  CMC joint thickening and subluxation bilaterally.  She has tenderness over both CMC joints especially the right CMC on examination today.  She got a left CMC joint brace after her last office visit which has been providing comfort and support.  She has been having increased discomfort in her left thumb so she plans on getting a  right CMC joint brace as well.  A prescription was provided to the patient today.  Discussed the importance of joint protection and muscle strengthening.  She was given a jar gripper to assist her at home.  She was also given a handout of hand exercises to perform.  She was advised to notify us if she develops increased joint pain or swelling.  Primary osteoarthritis of both knees - Severe end-stage osteoarthritis of bilateral knee joints and severe chondromalacia patella. Right knee injected with cortisone on 02/17/21.  She noticed significant improvement in her right knee joint pain after having a cortisone injection performed.  She started to have some increased discomfort about 2 weeks ago but her pain has been tolerable.  She has been having increased discomfort in the left knee joint.  She requested a left knee joint cortisone injection today.  She tolerated procedure well.  Procedure note was completed above.  Aftercare was discussed.  Discussed the importance of lower extremity muscle strengthening.  Primary osteoarthritis of both feet: She is  not experiencing any increased discomfort in her feet at this time.   Vitamin D deficiency: She is taking vitamin D 2000 units daily.   Essential hypertension: BP was 133/81 today in the office. Advised the patient to monitor her blood pressure closely following the cortisone injection.    Other medical conditions are listed as follows:   Nail dystrophy  History of gastroesophageal reflux (GERD)  History of hypothyroidism  OSA (obstructive sleep apnea)  Family history of systemic lupus erythematosus  Orders: Orders Placed This Encounter  Procedures   Large Joint Inj   No orders of the defined types were placed in this encounter.   Follow-Up Instructions: Return in about 3 months (around 08/17/2021) for Osteoarthritis.   Ofilia Neas, PA-C  Note - This record has been created using Dragon software.  Chart creation errors have been sought, but may not always  have been located. Such creation errors do not reflect on  the standard of medical care.

## 2021-05-19 ENCOUNTER — Encounter: Payer: Self-pay | Admitting: Physician Assistant

## 2021-05-19 ENCOUNTER — Other Ambulatory Visit: Payer: Self-pay

## 2021-05-19 ENCOUNTER — Ambulatory Visit: Payer: Medicare HMO | Admitting: Physician Assistant

## 2021-05-19 VITALS — BP 133/81 | HR 80 | Ht 64.0 in | Wt 197.8 lb

## 2021-05-19 DIAGNOSIS — Z8269 Family history of other diseases of the musculoskeletal system and connective tissue: Secondary | ICD-10-CM

## 2021-05-19 DIAGNOSIS — Z8719 Personal history of other diseases of the digestive system: Secondary | ICD-10-CM

## 2021-05-19 DIAGNOSIS — M170 Bilateral primary osteoarthritis of knee: Secondary | ICD-10-CM

## 2021-05-19 DIAGNOSIS — M1712 Unilateral primary osteoarthritis, left knee: Secondary | ICD-10-CM | POA: Diagnosis not present

## 2021-05-19 DIAGNOSIS — M25562 Pain in left knee: Secondary | ICD-10-CM | POA: Diagnosis not present

## 2021-05-19 DIAGNOSIS — I10 Essential (primary) hypertension: Secondary | ICD-10-CM | POA: Diagnosis not present

## 2021-05-19 DIAGNOSIS — M19042 Primary osteoarthritis, left hand: Secondary | ICD-10-CM

## 2021-05-19 DIAGNOSIS — G4733 Obstructive sleep apnea (adult) (pediatric): Secondary | ICD-10-CM

## 2021-05-19 DIAGNOSIS — Z8639 Personal history of other endocrine, nutritional and metabolic disease: Secondary | ICD-10-CM | POA: Diagnosis not present

## 2021-05-19 DIAGNOSIS — G8929 Other chronic pain: Secondary | ICD-10-CM

## 2021-05-19 DIAGNOSIS — E559 Vitamin D deficiency, unspecified: Secondary | ICD-10-CM

## 2021-05-19 DIAGNOSIS — M19072 Primary osteoarthritis, left ankle and foot: Secondary | ICD-10-CM

## 2021-05-19 DIAGNOSIS — M19041 Primary osteoarthritis, right hand: Secondary | ICD-10-CM

## 2021-05-19 DIAGNOSIS — M19071 Primary osteoarthritis, right ankle and foot: Secondary | ICD-10-CM

## 2021-05-19 DIAGNOSIS — L603 Nail dystrophy: Secondary | ICD-10-CM

## 2021-05-19 MED ORDER — TRIAMCINOLONE ACETONIDE 40 MG/ML IJ SUSP
40.0000 mg | INTRAMUSCULAR | Status: AC | PRN
  Administered 2021-05-19: 40 mg via INTRA_ARTICULAR

## 2021-05-19 MED ORDER — LIDOCAINE HCL 1 % IJ SOLN
1.5000 mL | INTRAMUSCULAR | Status: AC | PRN
  Administered 2021-05-19: 1.5 mL

## 2021-05-19 NOTE — Patient Instructions (Signed)
Hand Exercises Hand exercises can be helpful for almost anyone. These exercises can strengthen the hands, improve flexibility and movement, and increase blood flow to the hands. These results can make work and daily tasks easier. Hand exercises can be especially helpful for people who have joint pain from arthritis or have nerve damage from overuse (carpal tunnel syndrome). These exercises can also help people who have injured a hand. Exercises Most of these hand exercises are gentle stretching and motion exercises. It is usually safe to do them often throughout the day. Warming up your hands before exercise may help to reduce stiffness. You can do this with gentle massage or by placing your hands in warm water for 10-15 minutes. It is normal to feel some stretching, pulling, tightness, or mild discomfort as you begin new exercises. This will gradually improve. Stop an exercise right away if you feel sudden, severe pain or your pain gets worse. Ask your health care provider which exercises are best for you. Knuckle bend or "claw" fist  Stand or sit with your arm, hand, and all five fingers pointed straight up. Make sure to keep your wrist straight during the exercise. Gently bend your fingers down toward your palm until the tips of your fingers are touching the top of your palm. Keep your big knuckle straight and just bend the small knuckles in your fingers. Hold this position for __________ seconds. Straighten (extend) your fingers back to the starting position. Repeat this exercise 5-10 times with each hand. Full finger fist  Stand or sit with your arm, hand, and all five fingers pointed straight up. Make sure to keep your wrist straight during the exercise. Gently bend your fingers into your palm until the tips of your fingers are touching the middle of your palm. Hold this position for __________ seconds. Extend your fingers back to the starting position, stretching every joint fully. Repeat  this exercise 5-10 times with each hand. Straight fist Stand or sit with your arm, hand, and all five fingers pointed straight up. Make sure to keep your wrist straight during the exercise. Gently bend your fingers at the big knuckle, where your fingers meet your hand, and the middle knuckle. Keep the knuckle at the tips of your fingers straight and try to touch the bottom of your palm. Hold this position for __________ seconds. Extend your fingers back to the starting position, stretching every joint fully. Repeat this exercise 5-10 times with each hand. Tabletop  Stand or sit with your arm, hand, and all five fingers pointed straight up. Make sure to keep your wrist straight during the exercise. Gently bend your fingers at the big knuckle, where your fingers meet your hand, as far down as you can while keeping the small knuckles in your fingers straight. Think of forming a tabletop with your fingers. Hold this position for __________ seconds. Extend your fingers back to the starting position, stretching every joint fully. Repeat this exercise 5-10 times with each hand. Finger spread  Place your hand flat on a table with your palm facing down. Make sure your wrist stays straight as you do this exercise. Spread your fingers and thumb apart from each other as far as you can until you feel a gentle stretch. Hold this position for __________ seconds. Bring your fingers and thumb tight together again. Hold this position for __________ seconds. Repeat this exercise 5-10 times with each hand. Making circles  Stand or sit with your arm, hand, and all five fingers pointed   straight up. Make sure to keep your wrist straight during the exercise. Make a circle by touching the tip of your thumb to the tip of your index finger. Hold for __________ seconds. Then open your hand wide. Repeat this motion with your thumb and each finger on your hand. Repeat this exercise 5-10 times with each hand. Thumb  motion  Sit with your forearm resting on a table and your wrist straight. Your thumb should be facing up toward the ceiling. Keep your fingers relaxed as you move your thumb. Lift your thumb up as high as you can toward the ceiling. Hold for __________ seconds. Bend your thumb across your palm as far as you can, reaching the tip of your thumb for the small finger (pinkie) side of your palm. Hold for __________ seconds. Repeat this exercise 5-10 times with each hand. Grip strengthening  Hold a stress ball or other soft ball in the middle of your hand. Slowly increase the pressure, squeezing the ball as much as you can without causing pain. Think of bringing the tips of your fingers into the middle of your palm. All of your finger joints should bend when doing this exercise. Hold your squeeze for __________ seconds, then relax. Repeat this exercise 5-10 times with each hand. Contact a health care provider if: Your hand pain or discomfort gets much worse when you do an exercise. Your hand pain or discomfort does not improve within 2 hours after you exercise. If you have any of these problems, stop doing these exercises right away. Do not do them again unless your health care provider says that you can. Get help right away if: You develop sudden, severe hand pain or swelling. If this happens, stop doing these exercises right away. Do not do them again unless your health care provider says that you can. This information is not intended to replace advice given to you by your health care provider. Make sure you discuss any questions you have with your health care provider. Document Revised: 09/17/2020 Document Reviewed: 09/17/2020 Elsevier Patient Education  2022 [employer]  

## 2021-05-25 DIAGNOSIS — Z0100 Encounter for examination of eyes and vision without abnormal findings: Secondary | ICD-10-CM | POA: Diagnosis not present

## 2021-05-25 DIAGNOSIS — H25043 Posterior subcapsular polar age-related cataract, bilateral: Secondary | ICD-10-CM | POA: Diagnosis not present

## 2021-05-25 DIAGNOSIS — H16223 Keratoconjunctivitis sicca, not specified as Sjogren's, bilateral: Secondary | ICD-10-CM | POA: Diagnosis not present

## 2021-05-25 DIAGNOSIS — H25013 Cortical age-related cataract, bilateral: Secondary | ICD-10-CM | POA: Diagnosis not present

## 2021-07-06 DIAGNOSIS — Z1389 Encounter for screening for other disorder: Secondary | ICD-10-CM | POA: Diagnosis not present

## 2021-07-06 DIAGNOSIS — M5412 Radiculopathy, cervical region: Secondary | ICD-10-CM | POA: Diagnosis not present

## 2021-07-06 DIAGNOSIS — I10 Essential (primary) hypertension: Secondary | ICD-10-CM | POA: Diagnosis not present

## 2021-07-06 DIAGNOSIS — E785 Hyperlipidemia, unspecified: Secondary | ICD-10-CM | POA: Diagnosis not present

## 2021-07-06 DIAGNOSIS — E039 Hypothyroidism, unspecified: Secondary | ICD-10-CM | POA: Diagnosis not present

## 2021-07-06 DIAGNOSIS — Z0000 Encounter for general adult medical examination without abnormal findings: Secondary | ICD-10-CM | POA: Diagnosis not present

## 2021-07-06 DIAGNOSIS — M170 Bilateral primary osteoarthritis of knee: Secondary | ICD-10-CM | POA: Diagnosis not present

## 2021-08-04 NOTE — Progress Notes (Signed)
? ?Office Visit Note ? ?Patient: Jennifer Shields             ?Date of Birth: 1950/10/01           ?MRN: [id number]             ?PCP: Leeroy Cha, MD ?Referring: Leeroy Cha,* ?Visit Date: 08/18/2021 ?Occupation: @GUAROCC @ ? ?Subjective:  ?Pain in multiple joints ? ?History of Present Illness: Jennifer Shields is a 71 y.o. female history of osteoarthritis.  She states she has been experiencing some discomfort in her right shoulder joint.  She is uncertain if it is because of leaning on her right shoulder.  She is also wearing a right CMC brace which helps.  She has noticed increased puffiness in her right hand since she has been wearing the brace.  She continues to have some stiffness in her knee joints after prolonged walking.  Right knee joint is stiff.  She continues to have some discomfort in her feet off and on.  She denies any joint swelling. ? ?Activities of Daily Living:  ?Patient reports morning stiffness for a few minutes.   ?Patient Reports nocturnal pain.  ?Difficulty dressing/grooming: Denies ?Difficulty climbing stairs: Reports ?Difficulty getting out of chair: Reports ?Difficulty using hands for taps, buttons, cutlery, and/or writing: Reports ? ?Review of Systems  ?Constitutional:  Negative for fatigue.  ?HENT:  Positive for nose dryness. Negative for mouth sores and mouth dryness.   ?Eyes:  Positive for dryness. Negative for pain and itching.  ?Respiratory:  Negative for shortness of breath and difficulty breathing.   ?Cardiovascular:  Negative for chest pain and palpitations.  ?Gastrointestinal:  Negative for blood in stool, constipation and diarrhea.  ?Endocrine: Negative for increased urination.  ?Genitourinary:  Negative for difficulty urinating.  ?Musculoskeletal:  Positive for joint pain, joint pain, myalgias, morning stiffness and myalgias. Negative for joint swelling and muscle tenderness.  ?Skin:  Negative for color change, rash and redness.  ?Allergic/Immunologic:  Negative for susceptible to infections.  ?Neurological:  Positive for numbness. Negative for dizziness, headaches, memory loss and weakness.  ?Hematological:  Positive for bruising/bleeding tendency.  ?Psychiatric/Behavioral:  Negative for confusion.   ? ?PMFS History:  ?Patient Active Problem List  ? Diagnosis Date Noted  ? OSA (obstructive sleep apnea) 01/15/2021  ? History of hypothyroidism 01/15/2021  ? History of gastroesophageal reflux (GERD) 01/15/2021  ? Essential hypertension 01/15/2021  ? Vitamin D deficiency 01/15/2021  ?  ?Past Medical History:  ?Diagnosis Date  ? Allergy   ? Arthritis   ? in hands,knees ,feet and legs  ? Hypertension   ? Sleep apnea   ? does not uses c-pap at this time  ? Thyroid disease   ?  ?Family History  ?Problem Relation Age of Onset  ? Diabetes Sister   ? Lung disease Brother   ? Heart disease Brother   ? Breast cancer Neg Hx   ? ?Past Surgical History:  ?Procedure Laterality Date  ? ABDOMINAL HYSTERECTOMY    ? COLONOSCOPY    ? THYROIDECTOMY    ? ?Social History  ? ?Social History Narrative  ? Not on file  ? ?Immunization History  ?Administered Date(s) Administered  ? Moderna Sars-Covid-2 Vaccination 08/01/2019, 08/29/2019, 05/31/2020  ? Tdap 03/03/2016  ?  ? ?Objective: ?Vital Signs: BP (!) 159/88 (BP Location: Left Arm, Patient Position: Sitting, Cuff Size: Normal)   Pulse 80   Ht 5\' 4"  (1.626 m)   Wt 198 lb 12.8 oz (90.2 kg)  BMI 34.12 kg/m?   ? ?Physical Exam ?Vitals and nursing note reviewed.  ?Constitutional:   ?   Appearance: She is well-developed.  ?HENT:  ?   Head: Normocephalic and atraumatic.  ?Eyes:  ?   Conjunctiva/sclera: Conjunctivae normal.  ?Cardiovascular:  ?   Rate and Rhythm: Normal rate and regular rhythm.  ?   Heart sounds: Normal heart sounds.  ?Pulmonary:  ?   Effort: Pulmonary effort is normal.  ?   Breath sounds: Normal breath sounds.  ?Abdominal:  ?   General: Bowel sounds are normal.  ?   Palpations: Abdomen is soft.  ?Musculoskeletal:  ?    Cervical back: Normal range of motion.  ?Lymphadenopathy:  ?   Cervical: No cervical adenopathy.  ?Skin: ?   General: Skin is warm and dry.  ?   Capillary Refill: Capillary refill takes less than 2 seconds.  ?Neurological:  ?   Mental Status: She is alert and oriented to person, place, and time.  ?Psychiatric:     ?   Behavior: Behavior normal.  ?  ? ?Musculoskeletal Exam: C-spine was in good range of motion.  Shoulder joints were in good range of motion without any discomfort.  Elbow joints, wrist joints with good range of motion.  She has bilateral PIP and DIP thickening with no synovitis.  Hip joints with good range of motion.  She had warmth on palpation of her bilateral knee joints and discomfort with range of motion.  There was no tenderness over ankles or MTPs. ? ?CDAI Exam: ?CDAI Score: -- ?Patient Global: --; Provider Global: -- ?Swollen: --; Tender: -- ?Joint Exam 08/18/2021  ? ?No joint exam has been documented for this visit  ? ?There is currently no information documented on the homunculus. Go to the Rheumatology activity and complete the homunculus joint exam. ? ?Investigation: ?No additional findings. ? ?Imaging: ?No results found. ? ?Recent Labs: ?Lab Results  ?Component Value Date  ? WBC 6.3 05/31/2016  ? HGB 12.6 05/31/2016  ? PLT 297 05/31/2016  ? NA 140 05/31/2016  ? K 3.2 (L) 05/31/2016  ? CL 101 05/31/2016  ? CO2 32 05/31/2016  ? GLUCOSE 111 (H) 05/31/2016  ? BUN 8 05/31/2016  ? CREATININE 0.80 05/31/2016  ? CALCIUM 9.7 05/31/2016  ? GFRAA >60 05/31/2016  ? ? ?Speciality Comments: No specialty comments available. ? ?Procedures:  ?No procedures performed ?Allergies: Patient has no known allergies.  ? ?Assessment / Plan:     ?Visit Diagnoses: Chronic right shoulder pain-she has been experiencing some discomfort in her right shoulder joint.  Her right shoulder joint was in good range of motion.  She believes that she might be leaning more on her right shoulder.  I gave her a handout on shoulder  joint exercises. ? ?Primary osteoarthritis of both hands - X-ray showed severe erosive OA on 11/17/2020.  She continues to have some pain and stiffness in her hands.  Right CMC brace has been helpful.  A handout on hand exercises was given. ? ?Primary osteoarthritis of both knees - Severe end-stage osteoarthritis of bilateral knee joints and severe chondromalacia patella.  She has chronic pain and discomfort in bilateral knee joints.  She also had some warmth on palpation of her knee joints.  She had this relief in her left knee joint after the cortisone injection for few months.  Prescription for bilateral knee joint braces was given. ? ?Primary osteoarthritis of both feet-proper fitting shoes were discussed. ? ?Other medical problems listed as  follows: ? ?Vitamin D deficiency ? ?Essential hypertension-blood pressure was elevated.  Advised to monitor blood pressure. ? ?Nail dystrophy ? ?History of gastroesophageal reflux (GERD) ? ?History of hypothyroidism ? ?OSA (obstructive sleep apnea) ? ?Family history of systemic lupus erythematosus ? ?Orders: ?No orders of the defined types were placed in this encounter. ? ?No orders of the defined types were placed in this encounter. ? ? ? ?Follow-Up Instructions: Return in about 6 months (around 02/18/2022) for Osteoarthritis. ? ? ?Bo Merino, MD ? ?Note - This record has been created using [employer].  ?Chart creation errors have been sought, but may not always  ?have been located. Such creation errors do not reflect on  ?the standard of medical care.  ?

## 2021-08-18 ENCOUNTER — Other Ambulatory Visit: Payer: Self-pay

## 2021-08-18 ENCOUNTER — Ambulatory Visit (INDEPENDENT_AMBULATORY_CARE_PROVIDER_SITE_OTHER): Payer: No Typology Code available for payment source | Admitting: Rheumatology

## 2021-08-18 ENCOUNTER — Encounter: Payer: Self-pay | Admitting: Rheumatology

## 2021-08-18 VITALS — BP 159/88 | HR 80 | Ht 64.0 in | Wt 198.8 lb

## 2021-08-18 DIAGNOSIS — Z8639 Personal history of other endocrine, nutritional and metabolic disease: Secondary | ICD-10-CM | POA: Diagnosis not present

## 2021-08-18 DIAGNOSIS — Z8269 Family history of other diseases of the musculoskeletal system and connective tissue: Secondary | ICD-10-CM | POA: Diagnosis not present

## 2021-08-18 DIAGNOSIS — E559 Vitamin D deficiency, unspecified: Secondary | ICD-10-CM

## 2021-08-18 DIAGNOSIS — G8929 Other chronic pain: Secondary | ICD-10-CM

## 2021-08-18 DIAGNOSIS — M25511 Pain in right shoulder: Secondary | ICD-10-CM

## 2021-08-18 DIAGNOSIS — I10 Essential (primary) hypertension: Secondary | ICD-10-CM

## 2021-08-18 DIAGNOSIS — L603 Nail dystrophy: Secondary | ICD-10-CM

## 2021-08-18 DIAGNOSIS — G4733 Obstructive sleep apnea (adult) (pediatric): Secondary | ICD-10-CM

## 2021-08-18 DIAGNOSIS — M19041 Primary osteoarthritis, right hand: Secondary | ICD-10-CM | POA: Diagnosis not present

## 2021-08-18 DIAGNOSIS — M19072 Primary osteoarthritis, left ankle and foot: Secondary | ICD-10-CM

## 2021-08-18 DIAGNOSIS — M19042 Primary osteoarthritis, left hand: Secondary | ICD-10-CM

## 2021-08-18 DIAGNOSIS — M19071 Primary osteoarthritis, right ankle and foot: Secondary | ICD-10-CM

## 2021-08-18 DIAGNOSIS — M170 Bilateral primary osteoarthritis of knee: Secondary | ICD-10-CM | POA: Diagnosis not present

## 2021-08-18 DIAGNOSIS — Z8719 Personal history of other diseases of the digestive system: Secondary | ICD-10-CM | POA: Diagnosis not present

## 2021-08-18 NOTE — Patient Instructions (Signed)
Shoulder Exercises Ask your health care provider which exercises are safe for you. Do exercises exactly as told by your health care provider and adjust them as directed. It is normal to feel mild stretching, pulling, tightness, or discomfort as you do these exercises. Stop right away if you feel sudden pain or your pain gets worse. Do not begin these exercises until told by your health care provider. Stretching exercises External rotation and abduction This exercise is sometimes called corner stretch. This exercise rotates your arm outward (external rotation) and moves your arm out from your body (abduction). Stand in a doorway with one of your feet slightly in front of the other. This is called a staggered stance. If you cannot reach your forearms to the door frame, stand facing a corner of a room. Choose one of the following positions as told by your health care provider: Place your hands and forearms on the door frame above your head. Place your hands and forearms on the door frame at the height of your head. Place your hands on the door frame at the height of your elbows. Slowly move your weight onto your front foot until you feel a stretch across your chest and in the front of your shoulders. Keep your head and chest upright and keep your abdominal muscles tight. Hold for __________ seconds. To release the stretch, shift your weight to your back foot. Repeat __________ times. Complete this exercise __________ times a day. Extension, standing Stand and hold a broomstick, a cane, or a similar object behind your back. Your hands should be a little wider than shoulder width apart. Your palms should face away from your back. Keeping your elbows straight and your shoulder muscles relaxed, move the stick away from your body until you feel a stretch in your shoulders (extension). Avoid shrugging your shoulders while you move the stick. Keep your shoulder blades tucked down toward the middle of your  back. Hold for __________ seconds. Slowly return to the starting position. Repeat __________ times. Complete this exercise __________ times a day. Range-of-motion exercises Pendulum  Stand near a wall or a surface that you can hold onto for balance. Bend at the waist and let your left / right arm hang straight down. Use your other arm to support you. Keep your back straight and do not lock your knees. Relax your left / right arm and shoulder muscles, and move your hips and your trunk so your left / right arm swings freely. Your arm should swing because of the motion of your body, not because you are using your arm or shoulder muscles. Keep moving your hips and trunk so your arm swings in the following directions, as told by your health care provider: Side to side. Forward and backward. In clockwise and counterclockwise circles. Continue each motion for __________ seconds, or for as long as told by your health care provider. Slowly return to the starting position. Repeat __________ times. Complete this exercise __________ times a day. Shoulder flexion, standing  Stand and hold a broomstick, a cane, or a similar object. Place your hands a little more than shoulder width apart on the object. Your left / right hand should be palm up, and your other hand should be palm down. Keep your elbow straight and your shoulder muscles relaxed. Push the stick up with your healthy arm to raise your left / right arm in front of your body, and then over your head until you feel a stretch in your shoulder (flexion). Avoid  shrugging your shoulder while you raise your arm. Keep your shoulder blade tucked down toward the middle of your back. Hold for __________ seconds. Slowly return to the starting position. Repeat __________ times. Complete this exercise __________ times a day. Shoulder abduction, standing Stand and hold a broomstick, a cane, or a similar object. Place your hands a little more than shoulder  width apart on the object. Your left / right hand should be palm up, and your other hand should be palm down. Keep your elbow straight and your shoulder muscles relaxed. Push the object across your body toward your left / right side. Raise your left / right arm to the side of your body (abduction) until you feel a stretch in your shoulder. Do not raise your arm above shoulder height unless your health care provider tells you to do that. If directed, raise your arm over your head. Avoid shrugging your shoulder while you raise your arm. Keep your shoulder blade tucked down toward the middle of your back. Hold for __________ seconds. Slowly return to the starting position. Repeat __________ times. Complete this exercise __________ times a day. Internal rotation  Place your left / right hand behind your back, palm up. Use your other hand to dangle an exercise band, a towel, or a similar object over your shoulder. Grasp the band with your left / right hand so you are holding on to both ends. Gently pull up on the band until you feel a stretch in the front of your left / right shoulder. The movement of your arm toward the center of your body is called internal rotation. Avoid shrugging your shoulder while you raise your arm. Keep your shoulder blade tucked down toward the middle of your back. Hold for __________ seconds. Release the stretch by letting go of the band and lowering your hands. Repeat __________ times. Complete this exercise __________ times a day. Strengthening exercises External rotation  Sit in a stable chair without armrests. Secure an exercise band to a stable object at elbow height on your left / right side. Place a soft object, such as a folded towel or a small pillow, between your left / right upper arm and your body to move your elbow about 4 inches (10 cm) away from your side. Hold the end of the exercise band so it is tight and there is no slack. Keeping your elbow pressed  against the soft object, slowly move your forearm out, away from your abdomen (external rotation). Keep your body steady so only your forearm moves. Hold for __________ seconds. Slowly return to the starting position. Repeat __________ times. Complete this exercise __________ times a day. Shoulder abduction  Sit in a stable chair without armrests, or stand up. Hold a __________ weight in your left / right hand, or hold an exercise band with both hands. Start with your arms straight down and your left / right palm facing in, toward your body. Slowly lift your left / right hand out to your side (abduction). Do not lift your hand above shoulder height unless your health care provider tells you that this is safe. Keep your arms straight. Avoid shrugging your shoulder while you do this movement. Keep your shoulder blade tucked down toward the middle of your back. Hold for __________ seconds. Slowly lower your arm, and return to the starting position. Repeat __________ times. Complete this exercise __________ times a day. Shoulder extension Sit in a stable chair without armrests, or stand up. Secure an exercise band  to a stable object in front of you so it is at shoulder height. Hold one end of the exercise band in each hand. Your palms should face each other. Straighten your elbows and lift your hands up to shoulder height. Step back, away from the secured end of the exercise band, until the band is tight and there is no slack. Squeeze your shoulder blades together as you pull your hands down to the sides of your thighs (extension). Stop when your hands are straight down by your sides. Do not let your hands go behind your body. Hold for __________ seconds. Slowly return to the starting position. Repeat __________ times. Complete this exercise __________ times a day. Shoulder row Sit in a stable chair without armrests, or stand up. Secure an exercise band to a stable object in front of you so it  is at waist height. Hold one end of the exercise band in each hand. Position your palms so that your thumbs are facing the ceiling (neutral position). Bend each of your elbows to a 90-degree angle (right angle) and keep your upper arms at your sides. Step back until the band is tight and there is no slack. Slowly pull your elbows back behind you. Hold for __________ seconds. Slowly return to the starting position. Repeat __________ times. Complete this exercise __________ times a day. Shoulder press-ups  Sit in a stable chair that has armrests. Sit upright, with your feet flat on the floor. Put your hands on the armrests so your elbows are bent and your fingers are pointing forward. Your hands should be about even with the sides of your body. Push down on the armrests and use your arms to lift yourself off the chair. Straighten your elbows and lift yourself up as much as you comfortably can. Move your shoulder blades down, and avoid letting your shoulders move up toward your ears. Keep your feet on the ground. As you get stronger, your feet should support less of your body weight as you lift yourself up. Hold for __________ seconds. Slowly lower yourself back into the chair. Repeat __________ times. Complete this exercise __________ times a day. Wall push-ups  Stand so you are facing a stable wall. Your feet should be about one arm-length away from the wall. Lean forward and place your palms on the wall at shoulder height. Keep your feet flat on the floor as you bend your elbows and lean forward toward the wall. Hold for __________ seconds. Straighten your elbows to push yourself back to the starting position. Repeat __________ times. Complete this exercise __________ times a day. This information is not intended to replace advice given to you by your health care provider. Make sure you discuss any questions you have with your health care provider. Document Revised: 09/21/2018 Document  Reviewed: 06/29/2018 [employer] Patient Education  [place] Exercises Hand exercises can be helpful for almost anyone. These exercises can strengthen the hands, improve flexibility and movement, and increase blood flow to the hands. These results can make work and daily tasks easier. Hand exercises can be especially helpful for people who have joint pain from arthritis or have nerve damage from overuse (carpal tunnel syndrome). These exercises can also help people who have injured a hand. Exercises Most of these hand exercises are gentle stretching and motion exercises. It is usually safe to do them often throughout the day. Warming up your hands before exercise may help to reduce stiffness. You can do this with gentle massage or  by placing your hands in warm water for 10-15 minutes. It is normal to feel some stretching, pulling, tightness, or mild discomfort as you begin new exercises. This will gradually improve. Stop an exercise right away if you feel sudden, severe pain or your pain gets worse. Ask your health care provider which exercises are best for you. Knuckle bend or "claw" fist  Stand or sit with your arm, hand, and all five fingers pointed straight up. Make sure to keep your wrist straight during the exercise. Gently bend your fingers down toward your palm until the tips of your fingers are touching the top of your palm. Keep your big knuckle straight and just bend the small knuckles in your fingers. Hold this position for __________ seconds. Straighten (extend) your fingers back to the starting position. Repeat this exercise 5-10 times with each hand. Full finger fist  Stand or sit with your arm, hand, and all five fingers pointed straight up. Make sure to keep your wrist straight during the exercise. Gently bend your fingers into your palm until the tips of your fingers are touching the middle of your palm. Hold this position for __________ seconds. Extend your  fingers back to the starting position, stretching every joint fully. Repeat this exercise 5-10 times with each hand. Straight fist Stand or sit with your arm, hand, and all five fingers pointed straight up. Make sure to keep your wrist straight during the exercise. Gently bend your fingers at the big knuckle, where your fingers meet your hand, and the middle knuckle. Keep the knuckle at the tips of your fingers straight and try to touch the bottom of your palm. Hold this position for __________ seconds. Extend your fingers back to the starting position, stretching every joint fully. Repeat this exercise 5-10 times with each hand. Tabletop  Stand or sit with your arm, hand, and all five fingers pointed straight up. Make sure to keep your wrist straight during the exercise. Gently bend your fingers at the big knuckle, where your fingers meet your hand, as far down as you can while keeping the small knuckles in your fingers straight. Think of forming a tabletop with your fingers. Hold this position for __________ seconds. Extend your fingers back to the starting position, stretching every joint fully. Repeat this exercise 5-10 times with each hand. Finger spread  Place your hand flat on a table with your palm facing down. Make sure your wrist stays straight as you do this exercise. Spread your fingers and thumb apart from each other as far as you can until you feel a gentle stretch. Hold this position for __________ seconds. Bring your fingers and thumb tight together again. Hold this position for __________ seconds. Repeat this exercise 5-10 times with each hand. Making circles  Stand or sit with your arm, hand, and all five fingers pointed straight up. Make sure to keep your wrist straight during the exercise. Make a circle by touching the tip of your thumb to the tip of your index finger. Hold for __________ seconds. Then open your hand wide. Repeat this motion with your thumb and each  finger on your hand. Repeat this exercise 5-10 times with each hand. Thumb motion  Sit with your forearm resting on a table and your wrist straight. Your thumb should be facing up toward the ceiling. Keep your fingers relaxed as you move your thumb. Lift your thumb up as high as you can toward the ceiling. Hold for __________ seconds. Bend your thumb across  your palm as far as you can, reaching the tip of your thumb for the small finger (pinkie) side of your palm. Hold for __________ seconds. Repeat this exercise 5-10 times with each hand. Grip strengthening  Hold a stress ball or other soft ball in the middle of your hand. Slowly increase the pressure, squeezing the ball as much as you can without causing pain. Think of bringing the tips of your fingers into the middle of your palm. All of your finger joints should bend when doing this exercise. Hold your squeeze for __________ seconds, then relax. Repeat this exercise 5-10 times with each hand. Contact a health care provider if: Your hand pain or discomfort gets much worse when you do an exercise. Your hand pain or discomfort does not improve within 2 hours after you exercise. If you have any of these problems, stop doing these exercises right away. Do not do them again unless your health care provider says that you can. Get help right away if: You develop sudden, severe hand pain or swelling. If this happens, stop doing these exercises right away. Do not do them again unless your health care provider says that you can. This information is not intended to replace advice given to you by your health care provider. Make sure you discuss any questions you have with your health care provider. Document Revised: 09/17/2020 Document Reviewed: 09/17/2020 Else[employer] Patient Education  [place].

## 2021-08-25 IMAGING — MG DIGITAL SCREENING BILAT W/ TOMO W/ CAD
8 series · 8 of 24 positions shown · non-contrast
Comparison: Previous exam(s).

CLINICAL DATA: Screening.

EXAM:
DIGITAL SCREENING BILATERAL MAMMOGRAM WITH TOMO AND CAD

[R CC synth-2D]
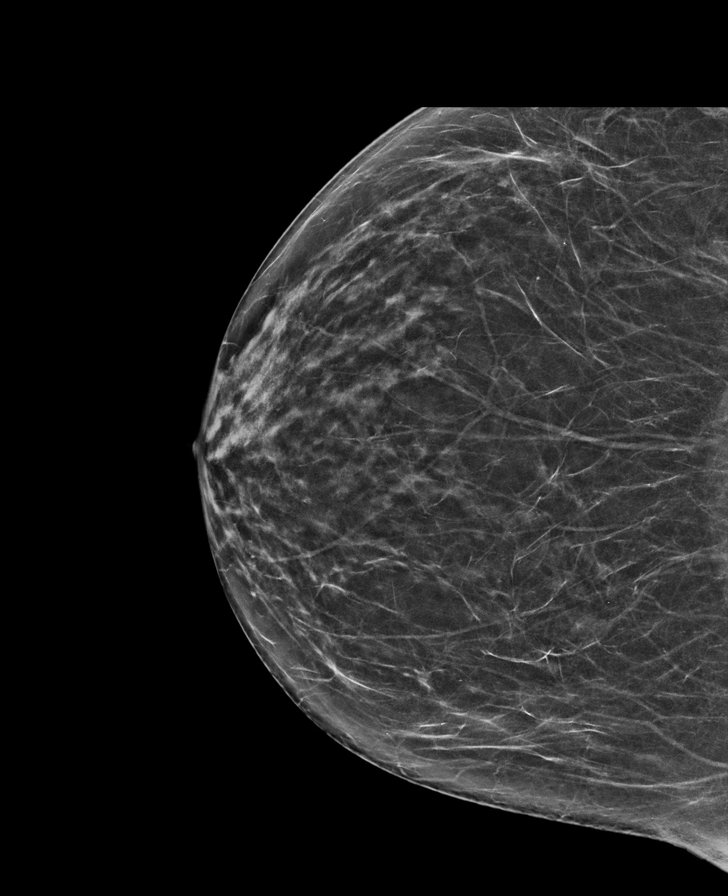

[L MLO synth-2D]
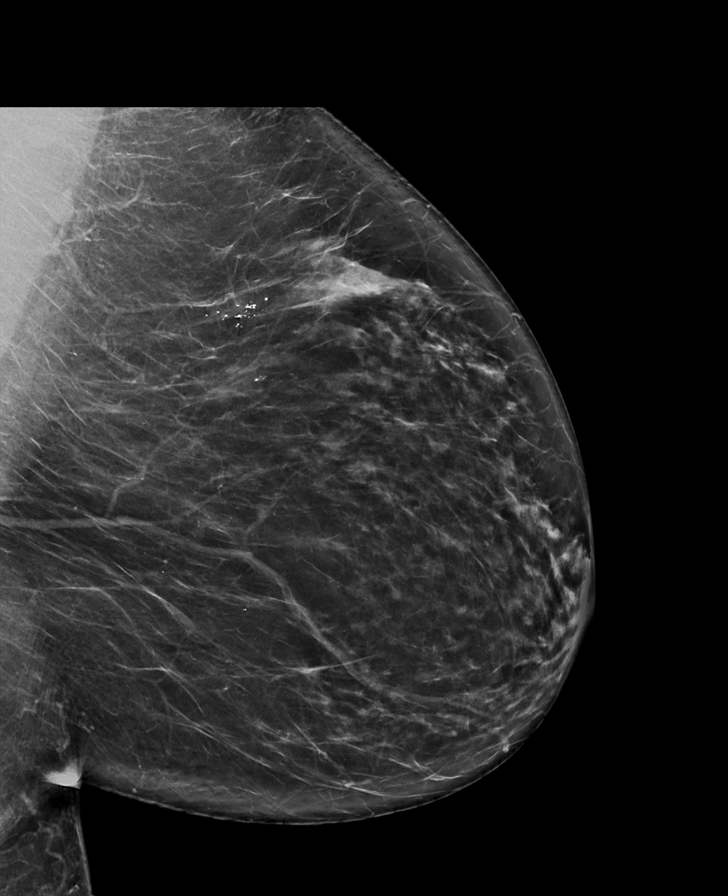

[R MLO synth-2D]
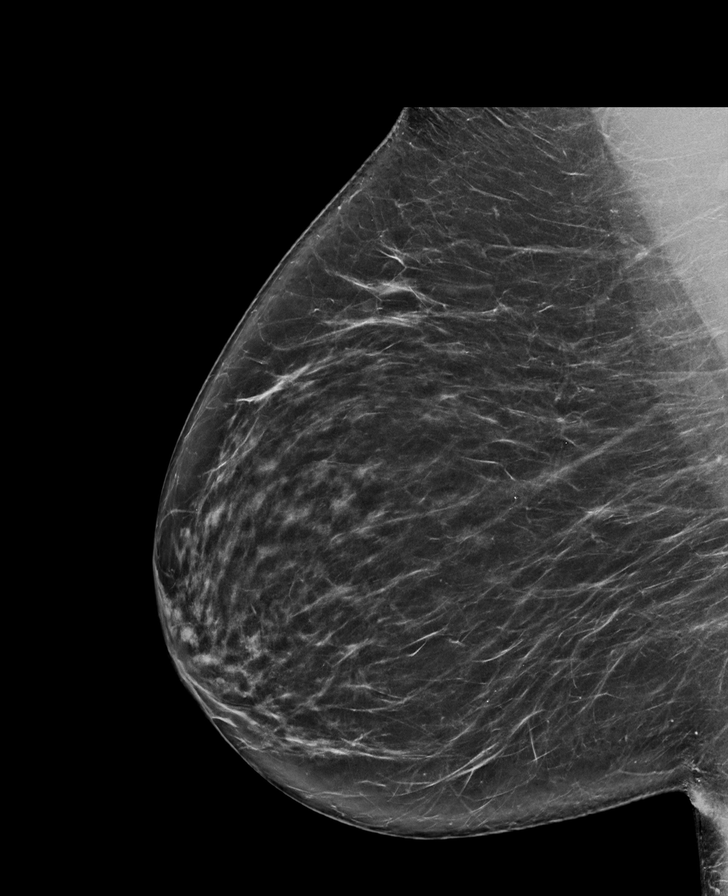

[L CC synth-2D]
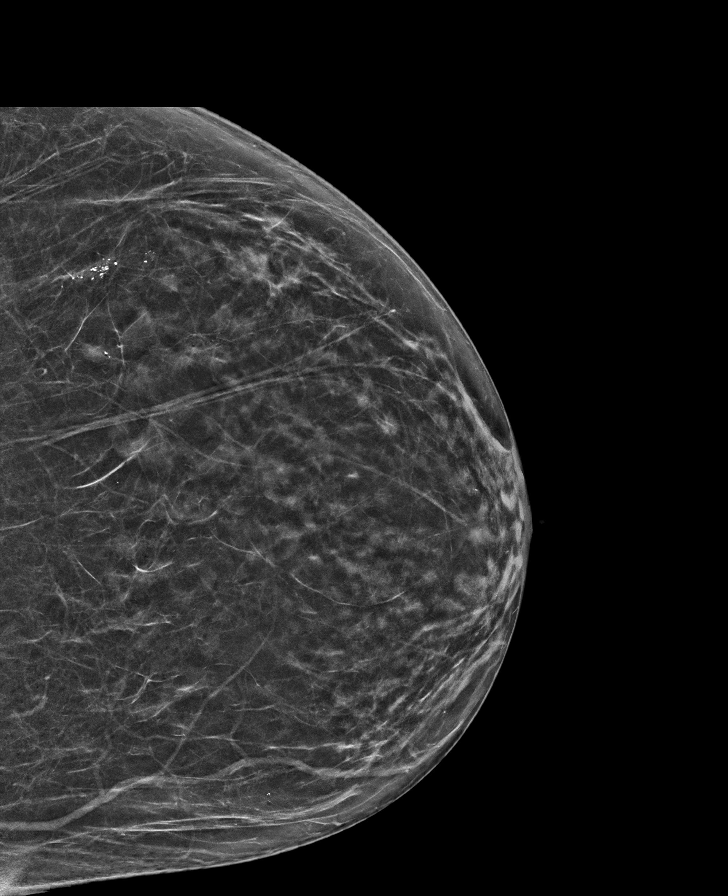

[L CC tomo · tomo slice 32/63.0]
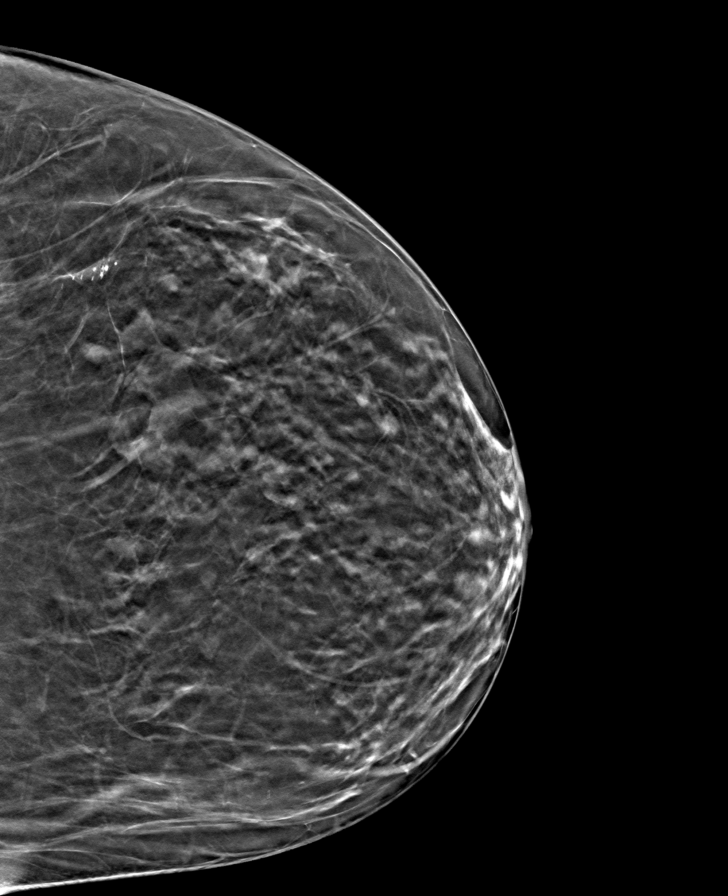

[R CC tomo · tomo slice 35/69.0]
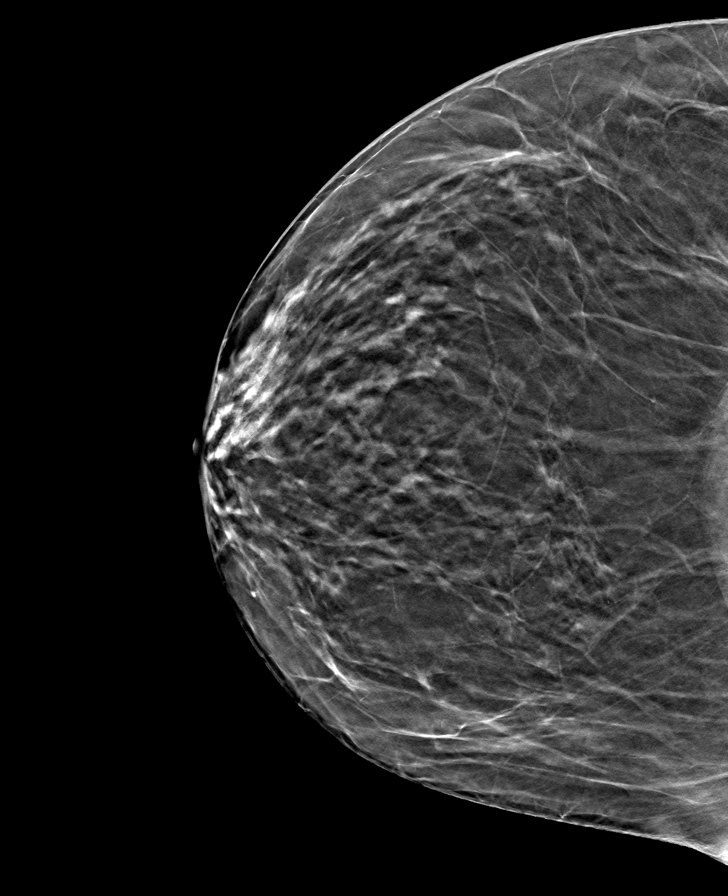

[R MLO tomo · tomo slice 41/81.0]
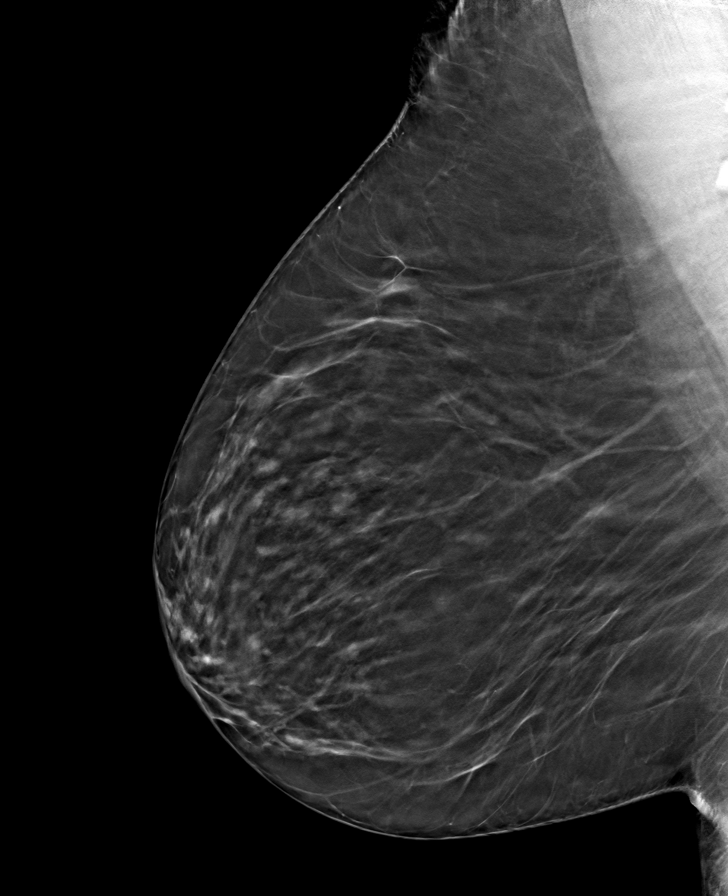

[L MLO tomo · tomo slice 39/76.0]
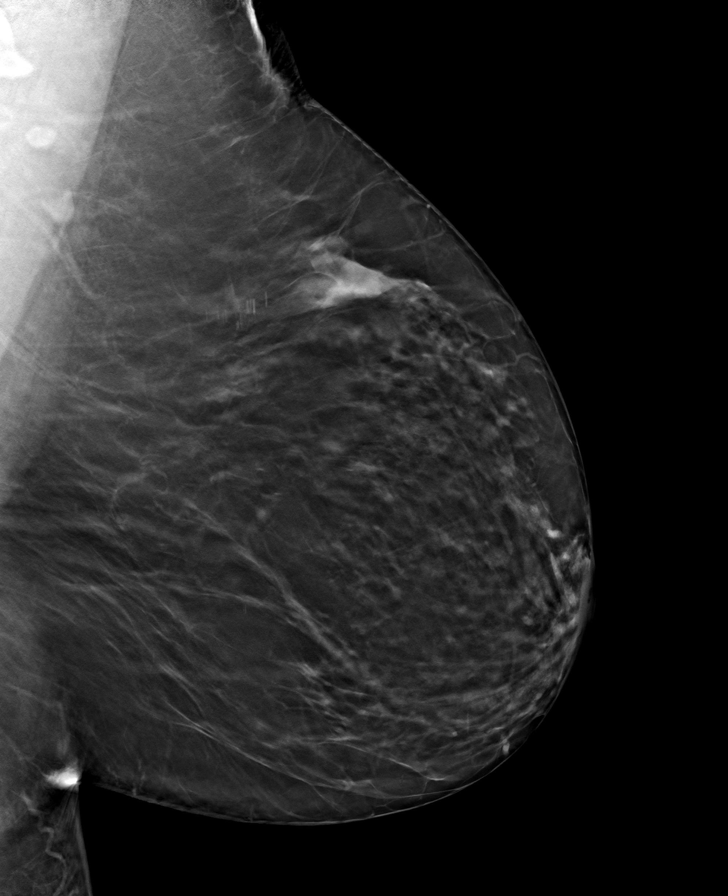

[8 of 24 positions shown; findings below may reference images not displayed]

ACR Breast Density Category b: There are scattered areas of
fibroglandular density.
FINDINGS: In the left breast, calcifications warrant further evaluation. In
the right breast, no findings suspicious for malignancy. Images were
processed with CAD.
IMPRESSION: Further evaluation is suggested for calcifications in the left
breast.

RECOMMENDATION:
Diagnostic mammogram of the left breast. (Code:[id number])

The patient will be contacted regarding the findings, and additional
imaging will be scheduled.

BI-RADS CATEGORY  0: Incomplete. Need additional imaging evaluation
and/or prior mammograms for comparison.

## 2021-08-29 DIAGNOSIS — G4733 Obstructive sleep apnea (adult) (pediatric): Secondary | ICD-10-CM | POA: Diagnosis not present

## 2021-09-08 DIAGNOSIS — G4733 Obstructive sleep apnea (adult) (pediatric): Secondary | ICD-10-CM | POA: Diagnosis not present

## 2021-10-29 DIAGNOSIS — G4733 Obstructive sleep apnea (adult) (pediatric): Secondary | ICD-10-CM | POA: Diagnosis not present

## 2022-01-04 DIAGNOSIS — R739 Hyperglycemia, unspecified: Secondary | ICD-10-CM | POA: Diagnosis not present

## 2022-01-04 DIAGNOSIS — I10 Essential (primary) hypertension: Secondary | ICD-10-CM | POA: Diagnosis not present

## 2022-01-04 DIAGNOSIS — G4733 Obstructive sleep apnea (adult) (pediatric): Secondary | ICD-10-CM | POA: Diagnosis not present

## 2022-02-04 NOTE — Progress Notes (Signed)
Office Visit Note  Patient: Jennifer Shields             Date of Birth: April 05, 1951           MRN: [id number]             PCP: Leeroy Cha, MD Referring: Leeroy Cha,* Visit Date: 02/18/2022 Occupation: '@GUAROCC'$ @  Subjective:  Pain in both knees  History of Present Illness: Jennifer Shields is a 71 y.o. female with history of osteoarthritis.  Patient continues to have chronic pain in multiple joints due to underlying osteoarthritis.  She experiences pain and stiffness in both hands and both knee joints on a daily basis.  Her pain has been most severe in the right knee joint and she notices joint swelling intermittently.  She is been taking Tylenol arthritis as needed and using Tiger balm topically for pain relief.  She noticed temporary relief in the left knee joint after having a cortisone injection in December 2022.  She continues to work 3 to 5 days a week.  Activities of Daily Living:  Patient reports morning stiffness for all day. Patient Reports nocturnal pain.  Difficulty dressing/grooming: Denies Difficulty climbing stairs: Reports Difficulty getting out of chair: Reports Difficulty using hands for taps, buttons, cutlery, and/or writing: Reports  Review of Systems  Constitutional:  Positive for fatigue.  HENT:  Positive for mouth dryness. Negative for mouth sores.   Eyes:  Negative for dryness.  Respiratory:  Negative for shortness of breath.   Cardiovascular:  Negative for chest pain and palpitations.  Gastrointestinal:  Negative for blood in stool, constipation and diarrhea.  Endocrine: Positive for increased urination.  Genitourinary:  Positive for involuntary urination.  Musculoskeletal:  Positive for joint pain, joint pain, joint swelling and morning stiffness. Negative for gait problem, myalgias, muscle weakness, muscle tenderness and myalgias.  Skin:  Negative for color change, rash, hair loss and sensitivity to sunlight.  Allergic/Immunologic:  Negative for susceptible to infections.  Neurological:  Negative for dizziness and headaches.  Hematological:  Negative for swollen glands.  Psychiatric/Behavioral:  Negative for depressed mood and sleep disturbance. The patient is not nervous/anxious.     PMFS History:  Patient Active Problem List   Diagnosis Date Noted   OSA (obstructive sleep apnea) 01/15/2021   History of hypothyroidism 01/15/2021   History of gastroesophageal reflux (GERD) 01/15/2021   Essential hypertension 01/15/2021   Vitamin D deficiency 01/15/2021    Past Medical History:  Diagnosis Date   Allergy    Arthritis    in hands,knees ,feet and legs   Hypertension    Sleep apnea    does not uses c-pap at this time   Thyroid disease     Family History  Problem Relation Age of Onset   Diabetes Sister    Lung disease Brother    Heart disease Brother    Stroke Daughter    Hypertension Daughter    Hypertension Son    Breast cancer Neg Hx    Past Surgical History:  Procedure Laterality Date   ABDOMINAL HYSTERECTOMY     COLONOSCOPY     THYROIDECTOMY     Social History   Social History Narrative   Not on file   Immunization History  Administered Date(s) Administered   Moderna Sars-Covid-2 Vaccination 08/01/2019, 08/29/2019, 05/31/2020   Tdap 03/03/2016     Objective: Vital Signs: BP 131/78 (BP Location: Left Arm, Patient Position: Sitting, Cuff Size: Normal)   Pulse 80   Resp 16  Ht '5\' 4"'$  (1.626 m)   Wt 201 lb (91.2 kg)   BMI 34.50 kg/m    Physical Exam Vitals and nursing note reviewed.  Constitutional:      Appearance: She is well-developed.  HENT:     Head: Normocephalic and atraumatic.  Eyes:     Conjunctiva/sclera: Conjunctivae normal.  Cardiovascular:     Rate and Rhythm: Normal rate and regular rhythm.     Heart sounds: Normal heart sounds.  Pulmonary:     Effort: Pulmonary effort is normal.     Breath sounds: Normal breath sounds.  Abdominal:     General: Bowel sounds are  normal.     Palpations: Abdomen is soft.  Musculoskeletal:     Cervical back: Normal range of motion.  Skin:    General: Skin is warm and dry.     Capillary Refill: Capillary refill takes less than 2 seconds.  Neurological:     Mental Status: She is alert and oriented to person, place, and time.  Psychiatric:        Behavior: Behavior normal.      Musculoskeletal Exam: C-spine has good range of motion.  No midline spinal tenderness or SI joint tenderness.  Shoulder joints have good range of motion with no discomfort.  Elbow joints have good range of motion with no tenderness or synovitis.  Limited extension of both wrist joints.  No tenderness or synovitis over MCP joints.  PIP and DIP thickening consistent with osteoarthritis of both hands.  Subluxation of several DIP joints noted.  Incomplete fist formation noted bilaterally.  Hip joints have good range of motion with no groin pain.  Painful range of motion of both knee joints with warmth of the right knee.  Ankle joints have good range of motion with no tenderness or joint swelling.   CDAI Exam: CDAI Score: -- Patient Global: --; Provider Global: -- Swollen: --; Tender: -- Joint Exam 02/18/2022   No joint exam has been documented for this visit   There is currently no information documented on the homunculus. Go to the Rheumatology activity and complete the homunculus joint exam.  Investigation: No additional findings.  Imaging: No results found.  Recent Labs: Lab Results  Component Value Date   WBC 6.3 05/31/2016   HGB 12.6 05/31/2016   PLT 297 05/31/2016   NA 140 05/31/2016   K 3.2 (L) 05/31/2016   CL 101 05/31/2016   CO2 32 05/31/2016   GLUCOSE 111 (H) 05/31/2016   BUN 8 05/31/2016   CREATININE 0.80 05/31/2016   CALCIUM 9.7 05/31/2016   GFRAA >60 05/31/2016    Speciality Comments: No specialty comments available.  Procedures:  No procedures performed Allergies: Patient has no known allergies.   Assessment  / Plan:     Visit Diagnoses: Chronic right shoulder pain: She has good range of motion of the right knee joint with no discomfort at this time.  She was given a handout of shoulder exercises to perform at her last office visit.    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  X-rays obtained on 11/17/2020 were reviewed today in the office.  She has severe PIP and DIP thickening consistent with osteoarthritis of both hands with subluxation of several DIP joints.  Limited extension of both wrist joints.  No tenderness or synovitis over MCP joints.  Discussed the importance of joint protection and muscle strengthening.  She is encouraged perform hand exercises daily.  I also discussed the list of natural anti-inflammatories  which she can start taking.  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  She continues to have chronic pain and stiffness in both knee joints.  Her pain has been most severe in the right knee joint with intermittent joint swelling.  Valgus deformity of the right knee noted on examination today.  She has painful range of motion of both knees with mild warmth but no effusion.  No Baker's cyst was palpable.  She had a left knee joint cortisone injection on 05/19/2021 which provided temporary relief.  She declined a right knee joint cortisone injection at this time.  Different treatment options were discussed today but ultimately due to the severity of her arthritis she will require knee replacement in the future.  Discussed the list of natural anti-inflammatories which she can start taking.  She plans on continuing to take Tylenol arthritis and using Tiger balm as needed for pain relief.  Discussed the importance of lower extremity muscle strengthening.  Primary osteoarthritis of both feet: She is not experiencing any increased discomfort in her feet at this  time.  She has good range of motion of both ankle joints with no tenderness or synovitis.  She is wearing proper fitting shoes.  Other medical conditions are listed as follows:  Vitamin D deficiency: She is taking vitamin D 2000 units daily.  Essential hypertension: Blood pressure was 131/78 today in the office.  History of gastroesophageal reflux (GERD)  Nail dystrophy  OSA (obstructive sleep apnea)  History of hypothyroidism  Family history of systemic lupus erythematosus  Orders: No orders of the defined types were placed in this encounter.  No orders of the defined types were placed in this encounter.    Follow-Up Instructions: Return in about 6 months (around 08/19/2022) for Osteoarthritis.   Ofilia Neas, PA-C  Note - This record has been created using Dragon software.  Chart creation errors have been sought, but may not always  have been located. Such creation errors do not reflect on  the standard of medical care.

## 2022-02-18 ENCOUNTER — Ambulatory Visit: Payer: No Typology Code available for payment source | Attending: Physician Assistant | Admitting: Physician Assistant

## 2022-02-18 ENCOUNTER — Encounter: Payer: Self-pay | Admitting: Physician Assistant

## 2022-02-18 VITALS — BP 131/78 | HR 80 | Resp 16 | Ht 64.0 in | Wt 201.0 lb

## 2022-02-18 DIAGNOSIS — Z8639 Personal history of other endocrine, nutritional and metabolic disease: Secondary | ICD-10-CM | POA: Diagnosis not present

## 2022-02-18 DIAGNOSIS — E559 Vitamin D deficiency, unspecified: Secondary | ICD-10-CM

## 2022-02-18 DIAGNOSIS — G8929 Other chronic pain: Secondary | ICD-10-CM

## 2022-02-18 DIAGNOSIS — M19071 Primary osteoarthritis, right ankle and foot: Secondary | ICD-10-CM

## 2022-02-18 DIAGNOSIS — M25511 Pain in right shoulder: Secondary | ICD-10-CM | POA: Diagnosis not present

## 2022-02-18 DIAGNOSIS — L603 Nail dystrophy: Secondary | ICD-10-CM

## 2022-02-18 DIAGNOSIS — M19041 Primary osteoarthritis, right hand: Secondary | ICD-10-CM

## 2022-02-18 DIAGNOSIS — G4733 Obstructive sleep apnea (adult) (pediatric): Secondary | ICD-10-CM

## 2022-02-18 DIAGNOSIS — M19042 Primary osteoarthritis, left hand: Secondary | ICD-10-CM

## 2022-02-18 DIAGNOSIS — I10 Essential (primary) hypertension: Secondary | ICD-10-CM

## 2022-02-18 DIAGNOSIS — Z8269 Family history of other diseases of the musculoskeletal system and connective tissue: Secondary | ICD-10-CM

## 2022-02-18 DIAGNOSIS — Z8719 Personal history of other diseases of the digestive system: Secondary | ICD-10-CM

## 2022-02-18 DIAGNOSIS — M19072 Primary osteoarthritis, left ankle and foot: Secondary | ICD-10-CM

## 2022-02-18 DIAGNOSIS — M170 Bilateral primary osteoarthritis of knee: Secondary | ICD-10-CM | POA: Diagnosis not present

## 2022-02-25 ENCOUNTER — Other Ambulatory Visit: Payer: Self-pay | Admitting: Internal Medicine

## 2022-02-25 DIAGNOSIS — Z1231 Encounter for screening mammogram for malignant neoplasm of breast: Secondary | ICD-10-CM

## 2022-04-07 ENCOUNTER — Ambulatory Visit: Payer: No Typology Code available for payment source

## 2022-05-02 ENCOUNTER — Ambulatory Visit: Payer: No Typology Code available for payment source

## 2022-07-14 ENCOUNTER — Ambulatory Visit
Admission: RE | Admit: 2022-07-14 | Discharge: 2022-07-14 | Disposition: A | Discharge status: Home / Self Care | Payer: No Typology Code available for payment source | Source: Ambulatory Visit | Attending: Internal Medicine | Admitting: Internal Medicine

## 2022-07-14 DIAGNOSIS — Z1231 Encounter for screening mammogram for malignant neoplasm of breast: Secondary | ICD-10-CM | POA: Diagnosis not present

## 2022-08-05 NOTE — Progress Notes (Signed)
Office Visit Note  Patient: Jennifer Shields             Date of Birth: 1950/11/04           MRN: [id number]             PCP: Leeroy Cha, MD Referring: Leeroy Cha,* Visit Date: 08/19/2022 Occupation: '@GUAROCC'$ @  Subjective:  Pain in joints  History of Present Illness: Jennifer Shields is a 72 y.o. female with history of osteoarthritis.  She continues to have some discomfort in her hands, knee joints and her feet due to underlying osteoarthritis.  She complains of swelling in her left wrist, bilateral hands, bilateral knee joints.  She takes Tylenol arthritis and uses some topical agents for discomfort.  She recently started taking omega-3 and has noticed some improvement in her joint pain.  She had left knee joint cortisone injection in December 2022 which was helpful.  She continues to have pain and swelling in her bilateral knee joints.    Activities of Daily Living:  Patient reports morning stiffness for 15 minutes.   Patient Denies nocturnal pain.  Difficulty dressing/grooming: Denies Difficulty climbing stairs: Denies Difficulty getting out of chair: Denies Difficulty using hands for taps, buttons, cutlery, and/or writing: Denies  Review of Systems  Constitutional:  Positive for fatigue.  HENT: Negative.  Negative for mouth sores and mouth dryness.   Eyes:  Positive for dryness.  Respiratory: Negative.  Negative for shortness of breath.   Cardiovascular:  Negative for palpitations.  Gastrointestinal: Negative.  Negative for blood in stool, constipation and diarrhea.  Endocrine: Positive for increased urination.  Genitourinary: Negative.  Negative for involuntary urination.  Musculoskeletal:  Positive for joint pain, gait problem, joint pain, joint swelling, muscle weakness and morning stiffness. Negative for myalgias, muscle tenderness and myalgias.  Skin: Negative.  Negative for color change, rash, hair loss and sensitivity to sunlight.   Allergic/Immunologic: Negative.  Negative for susceptible to infections.  Neurological:  Negative for dizziness and headaches.  Hematological: Negative.  Negative for swollen glands.  Psychiatric/Behavioral: Negative.  Negative for depressed mood and sleep disturbance. The patient is not nervous/anxious.     PMFS History:  Patient Active Problem List   Diagnosis Date Noted   OSA (obstructive sleep apnea) 01/15/2021   History of hypothyroidism 01/15/2021   History of gastroesophageal reflux (GERD) 01/15/2021   Essential hypertension 01/15/2021   Vitamin D deficiency 01/15/2021    Past Medical History:  Diagnosis Date   Allergy    Arthritis    in hands,knees ,feet and legs   Hypertension    Sleep apnea    does not uses c-pap at this time   Thyroid disease     Family History  Problem Relation Age of Onset   Diabetes Sister    Lung disease Brother    Heart disease Brother    Stroke Daughter    Hypertension Daughter    Hypertension Son    Breast cancer Neg Hx    Past Surgical History:  Procedure Laterality Date   ABDOMINAL HYSTERECTOMY     COLONOSCOPY     THYROIDECTOMY     Social History   Social History Narrative   Not on file   Immunization History  Administered Date(s) Administered   Moderna Sars-Covid-2 Vaccination 08/01/2019, 08/29/2019, 05/31/2020   Tdap 03/03/2016     Objective: Vital Signs: BP 124/74 (BP Location: Left Arm, Patient Position: Sitting, Cuff Size: Large)   Pulse 73   Resp 16  Ht '5\' 4"'$  (1.626 m)   Wt 189 lb 6.4 oz (85.9 kg)   BMI 32.51 kg/m    Physical Exam Vitals and nursing note reviewed.  Constitutional:      Appearance: She is well-developed.  HENT:     Head: Normocephalic and atraumatic.  Eyes:     Conjunctiva/sclera: Conjunctivae normal.  Cardiovascular:     Rate and Rhythm: Normal rate and regular rhythm.     Heart sounds: Normal heart sounds.  Pulmonary:     Effort: Pulmonary effort is normal.     Breath sounds:  Normal breath sounds.  Abdominal:     General: Bowel sounds are normal.     Palpations: Abdomen is soft.  Musculoskeletal:     Cervical back: Normal range of motion.  Lymphadenopathy:     Cervical: No cervical adenopathy.  Skin:    General: Skin is warm and dry.     Capillary Refill: Capillary refill takes less than 2 seconds.  Neurological:     Mental Status: She is alert and oriented to person, place, and time.  Psychiatric:        Behavior: Behavior normal.      Musculoskeletal Exam: She had limited lateral rotation of the cervical spine.  Shoulders, elbows, wrist joints were in good range of motion.  She had left wrist joint extensor tenosynovitis.  PIP swelling was noted in bilateral hands.  No synovitis was noted over MCP joints.  She had warmth and swelling in her bilateral knee joints.  There was no tenderness over ankles or MTPs.  CDAI Exam: CDAI Score: 19  Patient Global: 5 mm; Provider Global: 5 mm Swollen: 9 ; Tender: 9  Joint Exam 08/19/2022      Right  Left  Wrist     Swollen Tender  PIP 2  Swollen Tender  Swollen Tender  PIP 3  Swollen Tender  Swollen Tender  PIP 4  Swollen Tender  Swollen Tender  Knee  Swollen Tender  Swollen Tender     Investigation: No additional findings.  Imaging: No results found.  Recent Labs: Lab Results  Component Value Date   WBC 6.3 05/31/2016   HGB 12.6 05/31/2016   PLT 297 05/31/2016   NA 140 05/31/2016   K 3.2 (L) 05/31/2016   CL 101 05/31/2016   CO2 32 05/31/2016   GLUCOSE 111 (H) 05/31/2016   BUN 8 05/31/2016   CREATININE 0.80 05/31/2016   CALCIUM 9.7 05/31/2016   GFRAA >60 05/31/2016    Speciality Comments: No specialty comments available.  Procedures:  No procedures performed Allergies: Patient has no known allergies.   Assessment / Plan:     Visit Diagnoses: Chronic inflammatory arthritis -she had pain and swelling in multiple joints on the examination today.  She states she has been having pain and  swelling in her left wrist joint.  Left extensor tenosynovitis was noted.  She had swelling in several of the PIP joints as described above.  She had warmth and swelling in bilateral knee joints.  I will obtain following labs today.  I discussed possible trial of hydroxychloroquine.  Indications side effects contraindications were discussed at length.  Patient is willing to try hydroxychloroquine.  Handout was given and consent was taken.  Plan: Sedimentation rate, Rheumatoid factor, Cyclic citrul peptide antibody, IgG  Patient was counseled on the purpose, proper use, and adverse effects of hydroxychloroquine including nausea/diarrhea, skin rash, headaches, and sun sensitivity.  Advised patient to wear sunscreen once starting hydroxychloroquine  to reduce risk of rash associated with sun sensitivity.  Discussed importance of annual eye exams while on hydroxychloroquine to monitor to ocular toxicity and discussed importance of frequent laboratory monitoring.  Provided patient with eye exam form for baseline ophthalmologic exam.  Reviewed risk for QTC prolongation when used in combination with other QTc prolonging agents (including but not limited to antiarrhythmics, macrolide antibiotics, flouroquinolones, tricyclic antidepressants, citalopram, specific antipsychotics, ondansetron, migraine triptans, and methadone). Provided patient with educational materials on hydroxychloroquine and answered all questions.  Patient consented to hydroxychloroquine. Will upload consent in the media tab.      High risk medication use -will obtain labs today and then in 1 month, 3 months and then every 5 months.  A baseline eye examination was advised.  Patient has an ophthalmologist and will schedule appointment.  Plan: CBC with Differential/Platelet, COMPLETE METABOLIC PANEL WITH GFR, Glucose 6 phosphate dehydrogenase.  Information on immunization was placed in the AVS.    Primary osteoarthritis of both hands - Severe erosive  osteoarthritis.  She continues to have pain and discomfort in her bilateral hands.  Swelling was noted in her bilateral PIP joints today.  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  She continues to have pain and discomfort in her bilateral knee joints.  She does not want to have surgery.  She had warmth on palpation of bilateral knee joints today.  Primary osteoarthritis of both feet-no tenderness or synovitis was noted.  Vitamin D deficiency-she takes vitamin D supplement.  Essential hypertension-blood pressure was 124/74 today.  History of gastroesophageal reflux (GERD)  Nail dystrophy  OSA (obstructive sleep apnea)  History of hypothyroidism  Orders: Orders Placed This Encounter  Procedures   CBC with Differential/Platelet   COMPLETE METABOLIC PANEL WITH GFR   Sedimentation rate   Rheumatoid factor   Cyclic citrul peptide antibody, IgG   Glucose 6 phosphate dehydrogenase   Meds ordered this encounter  Medications   hydroxychloroquine (PLAQUENIL) 200 MG tablet    Sig: Take 1 tablet 200 mg BID Monday-Friday    Dispense:  40 tablet    Refill:  2     Follow-Up Instructions: Return in about 2 months (around 10/19/2022) for Chronic inflammatory arthritis.   Bo Merino, MD  Note - This record has been created using Editor, commissioning.  Chart creation errors have been sought, but may not always  have been located. Such creation errors do not reflect on  the standard of medical care.

## 2022-08-16 DIAGNOSIS — Z1231 Encounter for screening mammogram for malignant neoplasm of breast: Secondary | ICD-10-CM | POA: Diagnosis not present

## 2022-08-16 DIAGNOSIS — Z1331 Encounter for screening for depression: Secondary | ICD-10-CM | POA: Diagnosis not present

## 2022-08-16 DIAGNOSIS — E785 Hyperlipidemia, unspecified: Secondary | ICD-10-CM | POA: Diagnosis not present

## 2022-08-16 DIAGNOSIS — M170 Bilateral primary osteoarthritis of knee: Secondary | ICD-10-CM | POA: Diagnosis not present

## 2022-08-16 DIAGNOSIS — Z23 Encounter for immunization: Secondary | ICD-10-CM | POA: Diagnosis not present

## 2022-08-16 DIAGNOSIS — I10 Essential (primary) hypertension: Secondary | ICD-10-CM | POA: Diagnosis not present

## 2022-08-16 DIAGNOSIS — J309 Allergic rhinitis, unspecified: Secondary | ICD-10-CM | POA: Diagnosis not present

## 2022-08-16 DIAGNOSIS — E669 Obesity, unspecified: Secondary | ICD-10-CM | POA: Diagnosis not present

## 2022-08-16 DIAGNOSIS — E039 Hypothyroidism, unspecified: Secondary | ICD-10-CM | POA: Diagnosis not present

## 2022-08-16 DIAGNOSIS — G4733 Obstructive sleep apnea (adult) (pediatric): Secondary | ICD-10-CM | POA: Diagnosis not present

## 2022-08-16 DIAGNOSIS — Z0000 Encounter for general adult medical examination without abnormal findings: Secondary | ICD-10-CM | POA: Diagnosis not present

## 2022-08-19 ENCOUNTER — Encounter: Payer: Self-pay | Admitting: Rheumatology

## 2022-08-19 ENCOUNTER — Ambulatory Visit: Payer: No Typology Code available for payment source | Attending: Rheumatology | Admitting: Rheumatology

## 2022-08-19 VITALS — BP 124/74 | HR 73 | Resp 16 | Ht 64.0 in | Wt 189.4 lb

## 2022-08-19 DIAGNOSIS — E559 Vitamin D deficiency, unspecified: Secondary | ICD-10-CM

## 2022-08-19 DIAGNOSIS — M19041 Primary osteoarthritis, right hand: Secondary | ICD-10-CM

## 2022-08-19 DIAGNOSIS — M170 Bilateral primary osteoarthritis of knee: Secondary | ICD-10-CM

## 2022-08-19 DIAGNOSIS — Z8719 Personal history of other diseases of the digestive system: Secondary | ICD-10-CM | POA: Diagnosis not present

## 2022-08-19 DIAGNOSIS — M19071 Primary osteoarthritis, right ankle and foot: Secondary | ICD-10-CM

## 2022-08-19 DIAGNOSIS — Z79899 Other long term (current) drug therapy: Secondary | ICD-10-CM | POA: Diagnosis not present

## 2022-08-19 DIAGNOSIS — M1990 Unspecified osteoarthritis, unspecified site: Secondary | ICD-10-CM | POA: Diagnosis not present

## 2022-08-19 DIAGNOSIS — L603 Nail dystrophy: Secondary | ICD-10-CM

## 2022-08-19 DIAGNOSIS — M19042 Primary osteoarthritis, left hand: Secondary | ICD-10-CM | POA: Diagnosis not present

## 2022-08-19 DIAGNOSIS — M19072 Primary osteoarthritis, left ankle and foot: Secondary | ICD-10-CM

## 2022-08-19 DIAGNOSIS — I10 Essential (primary) hypertension: Secondary | ICD-10-CM | POA: Diagnosis not present

## 2022-08-19 DIAGNOSIS — G4733 Obstructive sleep apnea (adult) (pediatric): Secondary | ICD-10-CM | POA: Diagnosis not present

## 2022-08-19 DIAGNOSIS — Z8639 Personal history of other endocrine, nutritional and metabolic disease: Secondary | ICD-10-CM

## 2022-08-19 MED ORDER — HYDROXYCHLOROQUINE SULFATE 200 MG PO TABS: ORAL_TABLET | ORAL | 2 refills | Status: DC

## 2022-08-19 NOTE — Patient Instructions (Signed)
Hydroxychloroquine Tablets What is this medication? HYDROXYCHLOROQUINE (hye drox ee KLOR oh kwin) treats autoimmune conditions, such as rheumatoid arthritis and lupus. It works by slowing down an overactive immune system. It may also be used to prevent and treat malaria. It works by killing the parasite that causes malaria. It belongs to a group of medications called DMARDs. This medicine may be used for other purposes; ask your health care provider or pharmacist if you have questions. COMMON BRAND NAME(S): Plaquenil, Quineprox What should I tell my care team before I take this medication? They need to know if you have any of these conditions: Diabetes Eye disease, vision problems Frequently drink alcohol G6PD deficiency Heart disease Irregular heartbeat or rhythm Kidney disease Liver disease Porphyria Psoriasis An unusual or allergic reaction to hydroxychloroquine, other medications, foods, dyes, or preservatives Pregnant or trying to get pregnant Breastfeeding How should I use this medication? Take this medication by mouth with water. Take it as directed on the prescription label. Do not cut, crush, or chew this medication. Swallow the tablets whole. Take it with food. Do not take it more than directed. Take all of this medication unless your care team tells you to stop it early. Keep taking it even if you think you are better. Take products with antacids in them at a different time of day than this medication. Take this medication 4 hours before or 4 hours after antacids. Talk to your care team if you have questions. Talk to your care team about the use of this medication in children. While this medication may be prescribed for selected conditions, precautions do apply. Overdosage: If you think you have taken too much of this medicine contact a poison control center or emergency room at once. NOTE: This medicine is only for you. Do not share this medicine with others. What if I miss a  dose? If you miss a dose, take it as soon as you can. If it is almost time for your next dose, take only that dose. Do not take double or extra doses. What may interact with this medication? Do not take this medication with any of the following: Cisapride Dronedarone Pimozide Thioridazine This medication may also interact with the following: Ampicillin Antacids Cimetidine Cyclosporine Digoxin Kaolin Medications for diabetes, such as insulin, glipizide, glyburide Medications for seizures, such as carbamazepine, phenobarbital, phenytoin Mefloquine Methotrexate Other medications that cause heart rhythm changes Praziquantel This list may not describe all possible interactions. Give your health care provider a list of all the medicines, herbs, non-prescription drugs, or dietary supplements you use. Also tell them if you smoke, drink alcohol, or use illegal drugs. Some items may interact with your medicine. What should I watch for while using this medication? Visit your care team for regular checks on your progress. Tell your care team if your symptoms do not start to get better or if they get worse. You may need blood work done while you are taking this medication. If you take other medications that can affect heart rhythm, you may need more testing. Talk to your care team if you have questions. Your vision may be tested before and during use of this medication. Tell your care team right away if you have any change in your eyesight. This medication may cause serious skin reactions. They can happen weeks to months after starting the medication. Contact your care team right away if you notice fevers or flu-like symptoms with a rash. The rash may be red or purple and then turn  into blisters or peeling of the skin. Or, you might notice a red rash with swelling of the face, lips or lymph nodes in your neck or under your arms. If you or your family notice any changes in your behavior, such as new or  worsening depression, thoughts of harming yourself, anxiety, or other unusual or disturbing thoughts, or memory loss, call your care team right away. What side effects may I notice from receiving this medication? Side effects that you should report to your care team as soon as possible: Allergic reactions--skin rash, itching, hives, swelling of the face, lips, tongue, or throat Aplastic anemia--unusual weakness or fatigue, dizziness, headache, trouble breathing, increased bleeding or bruising Change in vision Heart rhythm changes--fast or irregular heartbeat, dizziness, feeling faint or lightheaded, chest pain, trouble breathing Infection--fever, chills, cough, or sore throat Low blood sugar (hypoglycemia)--tremors or shaking, anxiety, sweating, cold or clammy skin, confusion, dizziness, rapid heartbeat Muscle injury--unusual weakness or fatigue, muscle pain, dark yellow or brown urine, decrease in amount of urine Pain, tingling, or numbness in the hands or feet Rash, fever, and swollen lymph nodes Redness, blistering, peeling, or loosening of the skin, including inside the mouth Thoughts of suicide or self-harm, worsening mood, or feelings of depression Unusual bruising or bleeding Side effects that usually do not require medical attention (report to your care team if they continue or are bothersome): Diarrhea Headache Nausea Stomach pain Vomiting This list may not describe all possible side effects. Call your doctor for medical advice about side effects. You may report side effects to FDA at 1-800-FDA-1088. Where should I keep my medication? Keep out of the reach of children and pets. Store at room temperature up to 30 degrees C (86 degrees F). Protect from light. Get rid of any unused medication after the expiration date. To get rid of medications that are no longer needed or have expired: Take the medication to a medication take-back program. Check with your pharmacy or law enforcement  to find a location. If you cannot return the medication, check the label or package insert to see if the medication should be thrown out in the garbage or flushed down the toilet. If you are not sure, ask your care team. If it is safe to put it in the trash, empty the medication out of the container. Mix the medication with cat litter, dirt, coffee grounds, or other unwanted substance. Seal the mixture in a bag or container. Put it in the trash. NOTE: This sheet is a summary. It may not cover all possible information. If you have questions about this medicine, talk to your doctor, pharmacist, or health care provider.  2023 Elsevier/Gold Standard (2007-07-21 00:00:00)  Standing Labs We placed an order today for your standing lab work.   Please have your standing labs drawn in 1 month, 3 months and then every 5 months  Please have your labs drawn 2 weeks prior to your appointment so that the provider can discuss your lab results at your appointment, if possible.  Please note that you may see your imaging and lab results in [facility] before we have reviewed them. We will contact you once all results are reviewed. Please allow our office up to 72 hours to thoroughly review all of the results before contacting the office for clarification of your results.  WALK-IN LAB HOURS  Monday through Thursday from 8:00 am -12:30 pm and 1:00 pm-5:00 pm and Friday from 8:00 am-12:00 pm.  Patients with office visits requiring labs will be  seen before walk-in labs.  You may encounter longer than normal wait times. Please allow additional time. Wait times may be shorter on  Monday and Thursday afternoons.  We do not book appointments for walk-in labs. We appreciate your patience and understanding with our staff.   Labs are drawn by [facility]. Please bring your co-pay at the time of your lab draw.  You may receive a bill from [facility] for your lab work.  Please note if you are on Hydroxychloroquine and and an order has  been placed for a Hydroxychloroquine level,  you will need to have it drawn 4 hours or more after your last dose.  If you wish to have your labs drawn at another location, please call the office 24 hours in advance so we can fax the orders.  The office is located at [address] [place], [place], [place] [address]   If you have any questions regarding directions or hours of operation,  please call [phone].   As a reminder, please drink plenty of water prior to coming for your lab work. Thanks!   Vaccines You are taking a medication(s) that can suppress your immune system.  The following immunizations are recommended: Flu annually Covid-19  Td/Tdap (tetanus, diphtheria, pertussis) every 10 years Pneumonia (Prevnar 15 then Pneumovax 23 at least 1 year apart.  Alternatively, can take Prevnar 20 without needing additional dose) Shingrix: 2 doses from 4 weeks to 6 months apart  Please check with your PCP to make sure you are up to date.

## 2022-08-24 LAB — COMPLETE METABOLIC PANEL WITH GFR
AG Ratio: 1.2 (calc) (ref 1.0–2.5)
ALT: 8 U/L (ref 6–29)
AST: 12 U/L (ref 10–35)
Albumin: 4.1 g/dL (ref 3.6–5.1)
Alkaline phosphatase (APISO): 99 U/L (ref 37–153)
BUN: 13 mg/dL (ref 7–25)
CO2: 31 mmol/L (ref 20–32)
Calcium: 10.3 mg/dL (ref 8.6–10.4)
Chloride: 102 mmol/L (ref 98–110)
Creat: 0.75 mg/dL (ref 0.50–1.05)
Globulin: 3.5 g/dL (calc) (ref 1.9–3.7)
Glucose, Bld: 99 mg/dL (ref 65–99)
Potassium: 4 mmol/L (ref 3.5–5.3)
Sodium: 141 mmol/L (ref 135–146)
Total Bilirubin: 0.4 mg/dL (ref 0.2–1.2)
Total Protein: 7.6 g/dL (ref 6.1–8.1)
eGFR: 86 mL/min/{1.73_m2} (ref >=60)

## 2022-08-24 LAB — GLUCOSE 6 PHOSPHATE DEHYDROGENASE: G-6PDH: 12.5 U/g Hgb (ref 7.0–20.5)

## 2022-08-24 LAB — CBC WITH DIFFERENTIAL/PLATELET
Absolute Monocytes: 410 cells/uL (ref 200–950)
Basophils Absolute: 32 cells/uL (ref 0–200)
Basophils Relative: 0.5 %
Eosinophils Absolute: 88 cells/uL (ref 15–500)
Eosinophils Relative: 1.4 %
HCT: 37.4 % (ref 35.0–45.0)
Hemoglobin: 12.1 g/dL (ref 11.7–15.5)
Lymphs Abs: 2747 cells/uL (ref 850–3900)
MCH: 28.5 pg (ref 27.0–33.0)
MCHC: 32.4 g/dL (ref 32.0–36.0)
MCV: 88 fL (ref 80.0–100.0)
MPV: 11.3 fL (ref 7.5–12.5)
Monocytes Relative: 6.5 %
Neutro Abs: 3024 cells/uL (ref 1500–7800)
Neutrophils Relative %: 48 %
Platelets: 275 10*3/uL (ref 140–400)
RBC: 4.25 10*6/uL (ref 3.80–5.10)
RDW: 13 % (ref 11.0–15.0)
Total Lymphocyte: 43.6 %
WBC: 6.3 10*3/uL (ref 3.8–10.8)

## 2022-08-24 LAB — RHEUMATOID FACTOR: Rheumatoid fact SerPl-aCnc: <14 IU/mL (ref <14)

## 2022-08-24 LAB — CYCLIC CITRUL PEPTIDE ANTIBODY, IGG: Cyclic Citrullin Peptide Ab: <16 UNITS

## 2022-08-24 LAB — SEDIMENTATION RATE: Sed Rate: 29 mm/h (ref 0–30)

## 2022-08-24 NOTE — Progress Notes (Signed)
CBC, CMP, G6PD and sed rate are within normal limits.  Rheumatoid factor and anti-CCP are negative.

## 2022-11-11 NOTE — Progress Notes (Signed)
Office Visit Note  Patient: Jennifer Shields             Date of Birth: August 11, 1950           MRN: [id number]             PCP: Jennifer Ishihara, MD Referring: Jennifer Shields,* Visit Date: 11/25/2022 Occupation: @GUAROCC @  Subjective:  Medication monitoring   History of Present Illness: Jennifer Shields is a 72 y.o. female with history of chronic inflammatory arthritis and osteoarthritis.  Patient was started on plaquenil after her last office visit on 08/19/22.  She is taking plaquenil 200 mg 1 tablet by mouth twice daily Monday through Friday.  She has been tolerating Plaquenil without any side effects.  Patient states that she has noticed over 75% improvement in her joint pain and inflammation since initiating Plaquenil.  She states that her mobility has improved.  The pain in her right knee joint has resolved but she continues to have some warmth in the right knee.  She continues to use Tiger balm and Biofreeze topically as needed for pain relief.  She remains on meloxicam 7.5 mg 1 tablet daily for pain relief.     Activities of Daily Living:  Patient reports morning stiffness for all day. Patient Denies nocturnal pain.  Difficulty dressing/grooming: Denies Difficulty climbing stairs: Denies Difficulty getting out of chair: Reports Difficulty using hands for taps, buttons, cutlery, and/or writing: Reports  Review of Systems  Constitutional:  Positive for fatigue.  HENT:  Negative for mouth sores and mouth dryness.   Eyes:  Positive for dryness.  Respiratory:  Negative for shortness of breath.   Cardiovascular:  Negative for chest pain and palpitations.  Gastrointestinal:  Positive for diarrhea. Negative for blood in stool and constipation.  Endocrine: Negative for increased urination.  Genitourinary:  Positive for involuntary urination.  Musculoskeletal:  Positive for joint pain, gait problem, joint pain, joint swelling and morning stiffness. Negative for myalgias,  muscle weakness, muscle tenderness and myalgias.  Skin:  Negative for color change, rash, hair loss and sensitivity to sunlight.  Allergic/Immunologic: Negative for susceptible to infections.  Neurological:  Positive for headaches. Negative for dizziness.  Hematological:  Negative for swollen glands.  Psychiatric/Behavioral:  Negative for depressed mood and sleep disturbance. The patient is not nervous/anxious.     PMFS History:  Patient Active Problem List   Diagnosis Date Noted   OSA (obstructive sleep apnea) 01/15/2021   History of hypothyroidism 01/15/2021   History of gastroesophageal reflux (GERD) 01/15/2021   Essential hypertension 01/15/2021   Vitamin D deficiency 01/15/2021    Past Medical History:  Diagnosis Date   Allergy    Arthritis    in hands,knees ,feet and legs   Hypertension    Sleep apnea    does not uses c-pap at this time   Thyroid disease     Family History  Problem Relation Age of Onset   Diabetes Sister    Lung disease Brother    Heart disease Brother    Stroke Daughter    Hypertension Daughter    Hypertension Son    Breast cancer Neg Hx    Past Surgical History:  Procedure Laterality Date   ABDOMINAL HYSTERECTOMY     COLONOSCOPY     THYROIDECTOMY     Social History   Social History Narrative   Not on file   Immunization History  Administered Date(s) Administered   Moderna Sars-Covid-2 Vaccination 08/01/2019, 08/29/2019, 05/31/2020   Tdap 03/03/2016  Objective: Vital Signs: BP 114/65 (BP Location: Left Arm, Patient Position: Sitting, Cuff Size: Normal)   Pulse 72   Resp 13   Ht 5\' 4"  (1.626 m)   Wt 185 lb 6.4 oz (84.1 kg)   BMI 31.82 kg/m    Physical Exam Vitals and nursing note reviewed.  Constitutional:      Appearance: She is well-developed.  HENT:     Head: Normocephalic and atraumatic.  Eyes:     Conjunctiva/sclera: Conjunctivae normal.  Cardiovascular:     Rate and Rhythm: Normal rate and regular rhythm.      Heart sounds: Normal heart sounds.  Pulmonary:     Effort: Pulmonary effort is normal.     Breath sounds: Normal breath sounds.  Abdominal:     General: Bowel sounds are normal.     Palpations: Abdomen is soft.  Musculoskeletal:     Cervical back: Normal range of motion.  Lymphadenopathy:     Cervical: No cervical adenopathy.  Skin:    General: Skin is warm and dry.     Capillary Refill: Capillary refill takes less than 2 seconds.  Neurological:     Mental Status: She is alert and oriented to person, place, and time.  Psychiatric:        Behavior: Behavior normal.      Musculoskeletal Exam: C-spine has limited range of motion without rotation.  Crepitus noted.  Shoulder joints and elbow joints have good range of motion.  Left wrist extensor tenosynovitis noted.  Some inflammation noted in several PIP joints but no tenderness noted upon palpation.  No tenderness or synovitis over MCP joints.  Hip joints have good range of motion with no groin pain.  Warmth noted in the right knee.  Ankle joints have good range of motion with no tenderness or joint swelling.  CDAI Exam: CDAI Score: 10  Patient Global: 20 / 100; Provider Global: 20 / 100 Swollen: 6 ; Tender: 0  Joint Exam 11/25/2022      Right  Left  Wrist     Swollen   PIP 2  Swollen   Swollen   PIP 3  Swollen   Swollen   PIP 4     Swollen      Investigation: No additional findings.  Imaging: No results found.  Recent Labs: Lab Results  Component Value Date   WBC 6.3 08/19/2022   HGB 12.1 08/19/2022   PLT 275 08/19/2022   NA 141 08/19/2022   K 4.0 08/19/2022   CL 102 08/19/2022   CO2 31 08/19/2022   GLUCOSE 99 08/19/2022   BUN 13 08/19/2022   CREATININE 0.75 08/19/2022   BILITOT 0.4 08/19/2022   AST 12 08/19/2022   ALT 8 08/19/2022   PROT 7.6 08/19/2022   CALCIUM 10.3 08/19/2022   GFRAA >60 05/31/2016    Speciality Comments: No specialty comments available.  Procedures:  No procedures  performed Allergies: Patient has no known allergies.   Assessment / Plan:     Visit Diagnoses: Chronic inflammatory arthritis: Patient was initiated on trial of Plaquenil after her last office visit on 08/19/2022.  She has been taking Plaquenil 200 mg 1 tablet by mouth twice daily Monday through Friday.  She is tolerating Plaquenil without any side effects.  She has noticed over 75% improvement in her joint pain, inflammation, and mobility since initiating Plaquenil.  She has some warmth in the right knee but is not currently experiencing any pain.  No knee joint effusion noted today.  Extensor tenosynovitis was noted in the left wrist.  She continues to have some tenderness and inflammation in several PIP joints in both hands.  Patient will continue on Plaquenil as prescribed.  If her symptoms persist or worsen we may need to discuss combination therapy at her follow-up visit.  She will follow-up in the office in 3 months or sooner if needed.  High risk medication use - Plaquenil added after last visit on 08/19/22.  She is taking Plaquenil 200 mg 1 tablet by mouth twice daily Monday through Friday. No baseline plaquenil eye examination on file.  Patient plans on calling her ophthalmologist.  She was given an eye examination form to take with her to her upcoming appointment. CBC and CMP WNL on 08/19/22. Orders for CBC and CMP released today.   - Plan: CBC with Differential/Platelet, COMPLETE METABOLIC PANEL WITH GFR  Primary osteoarthritis of both hands - Severe erosive osteoarthritis.  She continues to have some tenderness and inflammation in the PIP joints of both hands.  Overall her pain level has improved significantly since initiating Plaquenil.  Extensor tenosynovitis was noted in the left wrist which has been intermittent instead of consistent on a daily basis since initiating Plaquenil.  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  Her mobility has improved since initiating Plaquenil.  The pain in her knees has resolved but she continues to have some stiffness after sitting for prolonged periods of time.  Warmth in the right knee was noted today.  Primary osteoarthritis of both feet: She wears proper fitting shoes.  Vitamin D deficiency: She is taking vitamin D 2000 units daily.  Other medical conditions are listed as follows:  Essential hypertension: Blood pressure was 114/65 today in the office.  History of gastroesophageal reflux (GERD)  Nail dystrophy  OSA (obstructive sleep apnea)  History of hypothyroidism  Family history of systemic lupus erythematosus  Orders: Orders Placed This Encounter  Procedures   CBC with Differential/Platelet   COMPLETE METABOLIC PANEL WITH GFR   No orders of the defined types were placed in this encounter.    Follow-Up Instructions: Return in about 3 months (around 02/25/2023).   Gearldine Bienenstock, PA-C  Note - This record has been created using Dragon software.  Chart creation errors have been sought, but may not always  have been located. Such creation errors do not reflect on  the standard of medical care.

## 2022-11-16 ENCOUNTER — Other Ambulatory Visit: Payer: Self-pay | Admitting: Rheumatology

## 2022-11-16 NOTE — Telephone Encounter (Signed)
Last Fill: 08/19/2022  Eye exam: not on file   Labs: 08/19/2022 CBC, CMP, G6PD and sed rate are within normal limits.    Next Visit: 11/25/2022  Last Visit: 08/19/2022  DX: Chronic inflammatory arthritis   Current Dose per office note 08/19/2022: not discussed  Attempted to contact the patient and left message to advise patient we are in need of her PLQ eye exam.   Okay to refill Plaquenil?

## 2022-11-25 ENCOUNTER — Ambulatory Visit: Payer: No Typology Code available for payment source | Attending: Physician Assistant | Admitting: Physician Assistant

## 2022-11-25 ENCOUNTER — Encounter: Payer: Self-pay | Admitting: Physician Assistant

## 2022-11-25 VITALS — BP 114/65 | HR 72 | Resp 13 | Ht 64.0 in | Wt 185.4 lb

## 2022-11-25 DIAGNOSIS — M19072 Primary osteoarthritis, left ankle and foot: Secondary | ICD-10-CM

## 2022-11-25 DIAGNOSIS — Z8269 Family history of other diseases of the musculoskeletal system and connective tissue: Secondary | ICD-10-CM

## 2022-11-25 DIAGNOSIS — M170 Bilateral primary osteoarthritis of knee: Secondary | ICD-10-CM

## 2022-11-25 DIAGNOSIS — M1990 Unspecified osteoarthritis, unspecified site: Secondary | ICD-10-CM

## 2022-11-25 DIAGNOSIS — Z8719 Personal history of other diseases of the digestive system: Secondary | ICD-10-CM

## 2022-11-25 DIAGNOSIS — Z8639 Personal history of other endocrine, nutritional and metabolic disease: Secondary | ICD-10-CM | POA: Diagnosis not present

## 2022-11-25 DIAGNOSIS — L603 Nail dystrophy: Secondary | ICD-10-CM

## 2022-11-25 DIAGNOSIS — Z79899 Other long term (current) drug therapy: Secondary | ICD-10-CM | POA: Diagnosis not present

## 2022-11-25 DIAGNOSIS — M19042 Primary osteoarthritis, left hand: Secondary | ICD-10-CM

## 2022-11-25 DIAGNOSIS — E559 Vitamin D deficiency, unspecified: Secondary | ICD-10-CM

## 2022-11-25 DIAGNOSIS — M19071 Primary osteoarthritis, right ankle and foot: Secondary | ICD-10-CM

## 2022-11-25 DIAGNOSIS — M19041 Primary osteoarthritis, right hand: Secondary | ICD-10-CM

## 2022-11-25 DIAGNOSIS — G4733 Obstructive sleep apnea (adult) (pediatric): Secondary | ICD-10-CM

## 2022-11-25 DIAGNOSIS — I10 Essential (primary) hypertension: Secondary | ICD-10-CM | POA: Diagnosis not present

## 2022-11-26 LAB — CBC WITH DIFFERENTIAL/PLATELET
Absolute Monocytes: 330 cells/uL (ref 200–950)
Basophils Absolute: 30 cells/uL (ref 0–200)
Basophils Relative: 0.6 %
Eosinophils Absolute: 100 cells/uL (ref 15–500)
Eosinophils Relative: 2 %
HCT: 36.6 % (ref 35.0–45.0)
Hemoglobin: 11.7 g/dL (ref 11.7–15.5)
Lymphs Abs: 2095 cells/uL (ref 850–3900)
MCH: 28.3 pg (ref 27.0–33.0)
MCHC: 32 g/dL (ref 32.0–36.0)
MCV: 88.6 fL (ref 80.0–100.0)
MPV: 10.7 fL (ref 7.5–12.5)
Monocytes Relative: 6.6 %
Neutro Abs: 2445 cells/uL (ref 1500–7800)
Neutrophils Relative %: 48.9 %
Platelets: 295 10*3/uL (ref 140–400)
RBC: 4.13 10*6/uL (ref 3.80–5.10)
RDW: 13.2 % (ref 11.0–15.0)
Total Lymphocyte: 41.9 %
WBC: 5 10*3/uL (ref 3.8–10.8)

## 2022-11-26 LAB — COMPLETE METABOLIC PANEL WITH GFR
AG Ratio: 1.1 (calc) (ref 1.0–2.5)
ALT: 12 U/L (ref 6–29)
AST: 15 U/L (ref 10–35)
Albumin: 3.9 g/dL (ref 3.6–5.1)
Alkaline phosphatase (APISO): 93 U/L (ref 37–153)
BUN: 11 mg/dL (ref 7–25)
CO2: 29 mmol/L (ref 20–32)
Calcium: 10 mg/dL (ref 8.6–10.4)
Chloride: 104 mmol/L (ref 98–110)
Creat: 0.76 mg/dL (ref 0.60–1.00)
Globulin: 3.4 g/dL (calc) (ref 1.9–3.7)
Glucose, Bld: 97 mg/dL (ref 65–99)
Potassium: 3.7 mmol/L (ref 3.5–5.3)
Sodium: 141 mmol/L (ref 135–146)
Total Bilirubin: 0.5 mg/dL (ref 0.2–1.2)
Total Protein: 7.3 g/dL (ref 6.1–8.1)
eGFR: 84 mL/min/{1.73_m2} (ref >=60)

## 2022-11-28 NOTE — Progress Notes (Signed)
CBC and CMP WNL

## 2023-01-30 ENCOUNTER — Other Ambulatory Visit: Payer: Self-pay | Admitting: Rheumatology

## 2023-02-02 DIAGNOSIS — H16142 Punctate keratitis, left eye: Secondary | ICD-10-CM | POA: Diagnosis not present

## 2023-02-02 DIAGNOSIS — H16223 Keratoconjunctivitis sicca, not specified as Sjogren's, bilateral: Secondary | ICD-10-CM | POA: Diagnosis not present

## 2023-02-02 DIAGNOSIS — H0288A Meibomian gland dysfunction right eye, upper and lower eyelids: Secondary | ICD-10-CM | POA: Diagnosis not present

## 2023-02-02 DIAGNOSIS — H0288B Meibomian gland dysfunction left eye, upper and lower eyelids: Secondary | ICD-10-CM | POA: Diagnosis not present

## 2023-02-14 DIAGNOSIS — E039 Hypothyroidism, unspecified: Secondary | ICD-10-CM | POA: Diagnosis not present

## 2023-02-14 DIAGNOSIS — M170 Bilateral primary osteoarthritis of knee: Secondary | ICD-10-CM | POA: Diagnosis not present

## 2023-02-14 DIAGNOSIS — M1990 Unspecified osteoarthritis, unspecified site: Secondary | ICD-10-CM | POA: Diagnosis not present

## 2023-02-14 DIAGNOSIS — I10 Essential (primary) hypertension: Secondary | ICD-10-CM | POA: Diagnosis not present

## 2023-02-15 NOTE — Progress Notes (Unsigned)
Office Visit Note  Patient: Jennifer Shields             Date of Birth: June 09, 1951           MRN: [id number]             PCP: Lorenda Ishihara, MD Referring: Lorenda Ishihara,* Visit Date: 03/01/2023 Occupation: @GUAROCC @  Subjective:  Medication monitoring   History of Present Illness: Jennifer Shields is a 72 y.o. female with history of chronic inflammatory arthritis and osteoarthritis.  Patient is taking plaquenil 200 mg 1 tablet by mouth twice daily Monday through Friday.  Plaquenil was added in March 2024.   No baseline plaquenil eye examination on file.  CBC and CMP updated on 11/25/22--WNL.  Orders for CBC and CMP released today.   Activities of Daily Living:  Patient reports morning stiffness for *** {minute/hour:19697}.   Patient {ACTIONS;DENIES/REPORTS:21021675::"Denies"} nocturnal pain.  Difficulty dressing/grooming: {ACTIONS;DENIES/REPORTS:21021675::"Denies"} Difficulty climbing stairs: {ACTIONS;DENIES/REPORTS:21021675::"Denies"} Difficulty getting out of chair: {ACTIONS;DENIES/REPORTS:21021675::"Denies"} Difficulty using hands for taps, buttons, cutlery, and/or writing: {ACTIONS;DENIES/REPORTS:21021675::"Denies"}  No Rheumatology ROS completed.   PMFS History:  Patient Active Problem List   Diagnosis Date Noted   OSA (obstructive sleep apnea) 01/15/2021   History of hypothyroidism 01/15/2021   History of gastroesophageal reflux (GERD) 01/15/2021   Essential hypertension 01/15/2021   Vitamin D deficiency 01/15/2021    Past Medical History:  Diagnosis Date   Allergy    Arthritis    in hands,knees ,feet and legs   Hypertension    Sleep apnea    does not uses c-pap at this time   Thyroid disease     Family History  Problem Relation Age of Onset   Diabetes Sister    Lung disease Brother    Heart disease Brother    Stroke Daughter    Hypertension Daughter    Hypertension Son    Breast cancer Neg Hx    Past Surgical History:  Procedure  Laterality Date   ABDOMINAL HYSTERECTOMY     COLONOSCOPY     THYROIDECTOMY     Social History   Social History Narrative   Not on file   Immunization History  Administered Date(s) Administered   Moderna Sars-Covid-2 Vaccination 08/01/2019, 08/29/2019, 05/31/2020   Tdap 03/03/2016     Objective: Vital Signs: There were no vitals taken for this visit.   Physical Exam Vitals and nursing note reviewed.  Constitutional:      Appearance: She is well-developed.  HENT:     Head: Normocephalic and atraumatic.  Eyes:     Conjunctiva/sclera: Conjunctivae normal.  Cardiovascular:     Rate and Rhythm: Normal rate and regular rhythm.     Heart sounds: Normal heart sounds.  Pulmonary:     Effort: Pulmonary effort is normal.     Breath sounds: Normal breath sounds.  Abdominal:     General: Bowel sounds are normal.     Palpations: Abdomen is soft.  Musculoskeletal:     Cervical back: Normal range of motion.  Lymphadenopathy:     Cervical: No cervical adenopathy.  Skin:    General: Skin is warm and dry.     Capillary Refill: Capillary refill takes less than 2 seconds.  Neurological:     Mental Status: She is alert and oriented to person, place, and time.  Psychiatric:        Behavior: Behavior normal.      Musculoskeletal Exam: ***  CDAI Exam: CDAI Score: -- Patient Global: --; Provider Global: -- Swollen: --;  Tender: -- Joint Exam 03/01/2023   No joint exam has been documented for this visit   There is currently no information documented on the homunculus. Go to the Rheumatology activity and complete the homunculus joint exam.  Investigation: No additional findings.  Imaging: No results found.  Recent Labs: Lab Results  Component Value Date   WBC 5.0 11/25/2022   HGB 11.7 11/25/2022   PLT 295 11/25/2022   NA 141 11/25/2022   K 3.7 11/25/2022   CL 104 11/25/2022   CO2 29 11/25/2022   GLUCOSE 97 11/25/2022   BUN 11 11/25/2022   CREATININE 0.76 11/25/2022    BILITOT 0.5 11/25/2022   AST 15 11/25/2022   ALT 12 11/25/2022   PROT 7.3 11/25/2022   CALCIUM 10.0 11/25/2022   GFRAA >60 05/31/2016    Speciality Comments: No specialty comments available.  Procedures:  No procedures performed Allergies: Patient has no known allergies.   Assessment / Plan:     Visit Diagnoses: Chronic inflammatory arthritis  High risk medication use  Primary osteoarthritis of both hands  Primary osteoarthritis of both knees  Primary osteoarthritis of both feet  Vitamin D deficiency  Essential hypertension  History of gastroesophageal reflux (GERD)  Nail dystrophy  OSA (obstructive sleep apnea)  History of hypothyroidism  Family history of systemic lupus erythematosus  Orders: No orders of the defined types were placed in this encounter.  No orders of the defined types were placed in this encounter.   Face-to-face time spent with patient was *** minutes. Greater than 50% of time was spent in counseling and coordination of care.  Follow-Up Instructions: No follow-ups on file.   Gearldine Bienenstock, PA-C  Note - This record has been created using Dragon software.  Chart creation errors have been sought, but may not always  have been located. Such creation errors do not reflect on  the standard of medical care.

## 2023-03-01 ENCOUNTER — Ambulatory Visit: Payer: Medicare PPO | Attending: Physician Assistant | Admitting: Physician Assistant

## 2023-03-01 ENCOUNTER — Encounter: Payer: Self-pay | Admitting: Physician Assistant

## 2023-03-01 VITALS — BP 105/69 | HR 73 | Resp 15 | Ht 64.0 in | Wt 184.8 lb

## 2023-03-01 DIAGNOSIS — M1380 Other specified arthritis, unspecified site: Secondary | ICD-10-CM

## 2023-03-01 DIAGNOSIS — Z8719 Personal history of other diseases of the digestive system: Secondary | ICD-10-CM | POA: Diagnosis not present

## 2023-03-01 DIAGNOSIS — Z79899 Other long term (current) drug therapy: Secondary | ICD-10-CM | POA: Diagnosis not present

## 2023-03-01 DIAGNOSIS — L603 Nail dystrophy: Secondary | ICD-10-CM | POA: Diagnosis not present

## 2023-03-01 DIAGNOSIS — I10 Essential (primary) hypertension: Secondary | ICD-10-CM

## 2023-03-01 DIAGNOSIS — M19071 Primary osteoarthritis, right ankle and foot: Secondary | ICD-10-CM

## 2023-03-01 DIAGNOSIS — M19072 Primary osteoarthritis, left ankle and foot: Secondary | ICD-10-CM

## 2023-03-01 DIAGNOSIS — Z8639 Personal history of other endocrine, nutritional and metabolic disease: Secondary | ICD-10-CM

## 2023-03-01 DIAGNOSIS — M19041 Primary osteoarthritis, right hand: Secondary | ICD-10-CM | POA: Diagnosis not present

## 2023-03-01 DIAGNOSIS — E559 Vitamin D deficiency, unspecified: Secondary | ICD-10-CM | POA: Diagnosis not present

## 2023-03-01 DIAGNOSIS — G4733 Obstructive sleep apnea (adult) (pediatric): Secondary | ICD-10-CM

## 2023-03-01 DIAGNOSIS — M170 Bilateral primary osteoarthritis of knee: Secondary | ICD-10-CM

## 2023-03-01 DIAGNOSIS — M1990 Unspecified osteoarthritis, unspecified site: Secondary | ICD-10-CM

## 2023-03-01 DIAGNOSIS — Z8269 Family history of other diseases of the musculoskeletal system and connective tissue: Secondary | ICD-10-CM

## 2023-03-01 DIAGNOSIS — M19042 Primary osteoarthritis, left hand: Secondary | ICD-10-CM

## 2023-03-01 MED ORDER — HYDROXYCHLOROQUINE SULFATE 200 MG PO TABS: ORAL_TABLET | ORAL | 0 refills | Status: DC

## 2023-03-02 LAB — COMPLETE METABOLIC PANEL WITH GFR
AG Ratio: 1.3 (calc) (ref 1.0–2.5)
ALT: 13 U/L (ref 6–29)
AST: 17 U/L (ref 10–35)
Albumin: 4 g/dL (ref 3.6–5.1)
Alkaline phosphatase (APISO): 95 U/L (ref 37–153)
BUN: 13 mg/dL (ref 7–25)
CO2: 30 mmol/L (ref 20–32)
Calcium: 10 mg/dL (ref 8.6–10.4)
Chloride: 103 mmol/L (ref 98–110)
Creat: 0.7 mg/dL (ref 0.60–1.00)
Globulin: 3.1 g/dL (calc) (ref 1.9–3.7)
Glucose, Bld: 89 mg/dL (ref 65–99)
Potassium: 4 mmol/L (ref 3.5–5.3)
Sodium: 140 mmol/L (ref 135–146)
Total Bilirubin: 0.5 mg/dL (ref 0.2–1.2)
Total Protein: 7.1 g/dL (ref 6.1–8.1)
eGFR: 93 mL/min/{1.73_m2} (ref >=60)

## 2023-03-02 LAB — CBC WITH DIFFERENTIAL/PLATELET
Absolute Monocytes: 391 cells/uL (ref 200–950)
Basophils Absolute: 38 cells/uL (ref 0–200)
Basophils Relative: 0.6 %
Eosinophils Absolute: 88 cells/uL (ref 15–500)
Eosinophils Relative: 1.4 %
HCT: 37.1 % (ref 35.0–45.0)
Hemoglobin: 11.6 g/dL — ABNORMAL LOW (ref 11.7–15.5)
Lymphs Abs: 2520 cells/uL (ref 850–3900)
MCH: 27.8 pg (ref 27.0–33.0)
MCHC: 31.3 g/dL — ABNORMAL LOW (ref 32.0–36.0)
MCV: 89 fL (ref 80.0–100.0)
MPV: 10.7 fL (ref 7.5–12.5)
Monocytes Relative: 6.2 %
Neutro Abs: 3263 cells/uL (ref 1500–7800)
Neutrophils Relative %: 51.8 %
Platelets: 263 10*3/uL (ref 140–400)
RBC: 4.17 10*6/uL (ref 3.80–5.10)
RDW: 13.1 % (ref 11.0–15.0)
Total Lymphocyte: 40 %
WBC: 6.3 10*3/uL (ref 3.8–10.8)

## 2023-03-02 NOTE — Progress Notes (Signed)
CMP WNL Hemoglobin is borderline low. Rest of CBC WNL.  Ok to continue current dose of plaquenil

## 2023-04-08 ENCOUNTER — Other Ambulatory Visit: Payer: Self-pay | Admitting: Physician Assistant

## 2023-05-29 ENCOUNTER — Other Ambulatory Visit: Payer: Self-pay | Admitting: Physician Assistant

## 2023-05-29 NOTE — Telephone Encounter (Signed)
Last Fill: 03/01/2023  Eye exam: I called patient, PLQ Eye Exam scheduled 08/2023 per patient   Labs: 03/01/2023  CMP WNL Hemoglobin is borderline low. Rest of CBC WNL. Ok to continue current dose of plaquenil  Next Visit: 08/02/2023  Last Visit: 03/01/2023  HQ:IONGEXB inflammatory arthritis   Current Dose per office note 03/01/2023: Plaquenil 200 mg 1 tablet by mouth twice daily Monday through Friday.   Okay to refill Plaquenil?

## 2023-07-07 ENCOUNTER — Other Ambulatory Visit: Payer: Self-pay | Admitting: Internal Medicine

## 2023-07-07 DIAGNOSIS — Z1231 Encounter for screening mammogram for malignant neoplasm of breast: Secondary | ICD-10-CM

## 2023-07-19 NOTE — Progress Notes (Deleted)
 Office Visit Note  Patient: Jennifer Shields             Date of Birth: [birth year]           MRN: [id number]             PCP: Lorenda Ishihara, MD Referring: Lorenda Ishihara,* Visit Date: 08/02/2023 Occupation: @GUAROCC @  Subjective:  No chief complaint on file.   History of Present Illness: Dolores Ewing Halteman is a 73 y.o. female ***     Activities of Daily Living:  Patient reports morning stiffness for *** {minute/hour:19697}.   Patient {ACTIONS;DENIES/REPORTS:21021675::"Denies"} nocturnal pain.  Difficulty dressing/grooming: {ACTIONS;DENIES/REPORTS:21021675::"Denies"} Difficulty climbing stairs: {ACTIONS;DENIES/REPORTS:21021675::"Denies"} Difficulty getting out of chair: {ACTIONS;DENIES/REPORTS:21021675::"Denies"} Difficulty using hands for taps, buttons, cutlery, and/or writing: {ACTIONS;DENIES/REPORTS:21021675::"Denies"}  No Rheumatology ROS completed.   PMFS History:  Patient Active Problem List   Diagnosis Date Noted   OSA (obstructive sleep apnea) 01/15/2021   History of hypothyroidism 01/15/2021   History of gastroesophageal reflux (GERD) 01/15/2021   Essential hypertension 01/15/2021   Vitamin D deficiency 01/15/2021    Past Medical History:  Diagnosis Date   Allergy    Arthritis    in hands,knees ,feet and legs   Hypertension    Sleep apnea    does not uses c-pap at this time   Thyroid disease     Family History  Problem Relation Age of Onset   Diabetes Sister    Lung disease Brother    Heart disease Brother    Stroke Daughter    Hypertension Daughter    Hypertension Son    Breast cancer Neg Hx    Past Surgical History:  Procedure Laterality Date   ABDOMINAL HYSTERECTOMY     COLONOSCOPY     THYROIDECTOMY     Social History   Social History Narrative   Not on file   Immunization History  Administered Date(s) Administered   Moderna Sars-Covid-2 Vaccination 08/01/2019, 08/29/2019, 05/31/2020   Tdap 03/03/2016      Objective: Vital Signs: There were no vitals taken for this visit.   Physical Exam   Musculoskeletal Exam: ***  CDAI Exam: CDAI Score: -- Patient Global: --; Provider Global: -- Swollen: --; Tender: -- Joint Exam 08/02/2023   No joint exam has been documented for this visit   There is currently no information documented on the homunculus. Go to the Rheumatology activity and complete the homunculus joint exam.  Investigation: No additional findings.  Imaging: No results found.  Recent Labs: Lab Results  Component Value Date   WBC 6.3 03/01/2023   HGB 11.6 (L) 03/01/2023   PLT 263 03/01/2023   NA 140 03/01/2023   K 4.0 03/01/2023   CL 103 03/01/2023   CO2 30 03/01/2023   GLUCOSE 89 03/01/2023   BUN 13 03/01/2023   CREATININE 0.70 03/01/2023   BILITOT 0.5 03/01/2023   AST 17 03/01/2023   ALT 13 03/01/2023   PROT 7.1 03/01/2023   CALCIUM 10.0 03/01/2023   GFRAA >60 05/31/2016    Speciality Comments: PLQ Eye Exam scheduled 08/2023 per patient  Procedures:  No procedures performed Allergies: Patient has no known allergies.   Assessment / Plan:     Visit Diagnoses: No diagnosis found.  Orders: No orders of the defined types were placed in this encounter.  No orders of the defined types were placed in this encounter.   Face-to-face time spent with patient was *** minutes. Greater than 50% of time was spent in counseling and coordination of care.  Follow-Up Instructions: No follow-ups on file.   Ellen Henri, CMA  Note - This record has been created using Animal nutritionist.  Chart creation errors have been sought, but may not always  have been located. Such creation errors do not reflect on  the standard of medical care.

## 2023-07-26 ENCOUNTER — Ambulatory Visit
Admission: RE | Admit: 2023-07-26 | Discharge: 2023-07-26 | Disposition: A | Discharge status: Home / Self Care | Payer: Medicare HMO | Source: Ambulatory Visit | Attending: Internal Medicine | Admitting: Internal Medicine

## 2023-07-26 DIAGNOSIS — Z1231 Encounter for screening mammogram for malignant neoplasm of breast: Secondary | ICD-10-CM | POA: Diagnosis not present

## 2023-08-02 ENCOUNTER — Ambulatory Visit: Payer: Medicare PPO | Admitting: Rheumatology

## 2023-08-02 DIAGNOSIS — Z8269 Family history of other diseases of the musculoskeletal system and connective tissue: Secondary | ICD-10-CM

## 2023-08-02 DIAGNOSIS — M19071 Primary osteoarthritis, right ankle and foot: Secondary | ICD-10-CM

## 2023-08-02 DIAGNOSIS — L603 Nail dystrophy: Secondary | ICD-10-CM

## 2023-08-02 DIAGNOSIS — M1990 Unspecified osteoarthritis, unspecified site: Secondary | ICD-10-CM

## 2023-08-02 DIAGNOSIS — E559 Vitamin D deficiency, unspecified: Secondary | ICD-10-CM

## 2023-08-02 DIAGNOSIS — I10 Essential (primary) hypertension: Secondary | ICD-10-CM

## 2023-08-02 DIAGNOSIS — G4733 Obstructive sleep apnea (adult) (pediatric): Secondary | ICD-10-CM

## 2023-08-02 DIAGNOSIS — M19041 Primary osteoarthritis, right hand: Secondary | ICD-10-CM

## 2023-08-02 DIAGNOSIS — Z8719 Personal history of other diseases of the digestive system: Secondary | ICD-10-CM

## 2023-08-02 DIAGNOSIS — Z79899 Other long term (current) drug therapy: Secondary | ICD-10-CM

## 2023-08-02 DIAGNOSIS — Z8639 Personal history of other endocrine, nutritional and metabolic disease: Secondary | ICD-10-CM

## 2023-08-02 DIAGNOSIS — M170 Bilateral primary osteoarthritis of knee: Secondary | ICD-10-CM

## 2023-08-03 NOTE — Progress Notes (Unsigned)
 Office Visit Note  Patient: Jennifer Shields             Date of Birth: Nov 02, 1950           MRN: [id number]             PCP: Lorenda Ishihara, MD Referring: Lorenda Ishihara,* Visit Date: 08/17/2023 Occupation: @GUAROCC @  Subjective:  Medication monitoring    History of Present Illness: Jennifer Shields is a 73 y.o. female with history of chronic inflammatory arthritis and osteoarthritis.  Patient remains on Plaquenil 200 mg 1 tablet by mouth twice daily Monday through Friday.  She is tolerating Plaquenil without any side effects and has not had any recent gaps in therapy.  She denies any signs or symptoms of a flare.  Patient reports that overall her mobility has improved.  She continues to have occasional discomfort in her right knee and uses topical agents including Tiger balm as needed.  Patient states that if she is standing or walking prolonged periods of time she has an exacerbation of pain but once she rests her symptoms improved.  She has been using a compression sleeve on her right knee which is helpful.  She declined a cortisone injection today.  She has been taking meloxicam 7.5 mg 1 tablet daily for pain relief.  She denies any other joint pain or joint swelling at this time.    Activities of Daily Living:  Patient reports morning stiffness for 5-10 minutes.   Patient Denies nocturnal pain.  Difficulty dressing/grooming: Denies Difficulty climbing stairs: Reports Difficulty getting out of chair: Reports Difficulty using hands for taps, buttons, cutlery, and/or writing: Reports  Review of Systems  Constitutional:  Positive for fatigue.  HENT:  Positive for mouth dryness. Negative for mouth sores.   Eyes:  Positive for dryness.  Respiratory:  Negative for shortness of breath.   Cardiovascular:  Positive for palpitations. Negative for chest pain.  Gastrointestinal:  Negative for blood in stool, constipation and diarrhea.  Endocrine: Positive for increased  urination.  Genitourinary:  Positive for involuntary urination.  Musculoskeletal:  Positive for joint pain, gait problem, joint pain, joint swelling, morning stiffness and muscle tenderness. Negative for myalgias, muscle weakness and myalgias.  Skin:  Positive for color change, hair loss and sensitivity to sunlight. Negative for rash.  Allergic/Immunologic: Negative for susceptible to infections.  Neurological:  Negative for dizziness and headaches.  Hematological:  Negative for swollen glands.  Psychiatric/Behavioral:  Negative for depressed mood and sleep disturbance. The patient is not nervous/anxious.     PMFS History:  Patient Active Problem List   Diagnosis Date Noted  . OSA (obstructive sleep apnea) 01/15/2021  . History of hypothyroidism 01/15/2021  . History of gastroesophageal reflux (GERD) 01/15/2021  . Essential hypertension 01/15/2021  . Vitamin D deficiency 01/15/2021    Past Medical History:  Diagnosis Date  . Allergy   . Arthritis    in hands,knees ,feet and legs  . Hypertension   . Sleep apnea    does not uses c-pap at this time  . Thyroid disease     Family History  Problem Relation Age of Onset  . Diabetes Sister   . Stroke Daughter   . Hypertension Daughter   . Lung disease Brother   . Heart disease Brother   . Hypertension Son   . Breast cancer Neg Hx   . BRCA 1/2 Neg Hx    Past Surgical History:  Procedure Laterality Date  . ABDOMINAL HYSTERECTOMY    .  BREAST BIOPSY Left 04/28/2020  . COLONOSCOPY    . THYROIDECTOMY     Social History   Social History Narrative  . Not on file   Immunization History  Administered Date(s) Administered  . Moderna Sars-Covid-2 Vaccination 08/01/2019, 08/29/2019, 05/31/2020  . Tdap 03/03/2016     Objective: Vital Signs: BP 125/79 (BP Location: Left Arm, Patient Position: Sitting, Cuff Size: Large)   Pulse 73   Resp 14   Ht 5\' 4"  (1.626 m)   Wt 191 lb (86.6 kg)   BMI 32.79 kg/m    Physical Exam Vitals  and nursing note reviewed.  Constitutional:      Appearance: She is well-developed.  HENT:     Head: Normocephalic and atraumatic.  Eyes:     Conjunctiva/sclera: Conjunctivae normal.  Cardiovascular:     Rate and Rhythm: Normal rate and regular rhythm.     Heart sounds: Normal heart sounds.  Pulmonary:     Effort: Pulmonary effort is normal.     Breath sounds: Normal breath sounds.  Abdominal:     General: Bowel sounds are normal.     Palpations: Abdomen is soft.  Musculoskeletal:     Cervical back: Normal range of motion.  Lymphadenopathy:     Cervical: No cervical adenopathy.  Skin:    General: Skin is warm and dry.     Capillary Refill: Capillary refill takes less than 2 seconds.  Neurological:     Mental Status: She is alert and oriented to person, place, and time.  Psychiatric:        Behavior: Behavior normal.     Musculoskeletal Exam: C-spine, thoracic spine, lumbar spine and good range of motion.  Shoulder joints have slightly limited range of motion.  Elbow joints have good range of motion.  Limited extension of both wrist joints.  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  Severe PIP and DIP thickening.  CMC joint thickening noted bilaterally.  Hip joints have good range of motion with no groin pain.  Discomfort with range of motion of her right knee.  Mild warmth of the right knee noted.  No effusion noted.  Ankle joints have good range of motion with no joint tenderness or swelling.   CDAI Exam: CDAI Score: -- Patient Global: --; Provider Global: -- Swollen: --; Tender: -- Joint Exam 08/17/2023   No joint exam has been documented for this visit   There is currently no information documented on the homunculus. Go to the Rheumatology activity and complete the homunculus joint exam.  Investigation: No additional findings.  Imaging: MM 3D SCREENING MAMMOGRAM BILATERAL BREAST Result Date: 07/28/2023 CLINICAL DATA:  Screening. EXAM: DIGITAL SCREENING  BILATERAL MAMMOGRAM WITH TOMOSYNTHESIS AND CAD TECHNIQUE: Bilateral screening digital craniocaudal and mediolateral oblique mammograms were obtained. Bilateral screening digital breast tomosynthesis was performed. The images were evaluated with computer-aided detection. COMPARISON:  Previous exam(s). ACR Breast Density Category b: There are scattered areas of fibroglandular density. FINDINGS: There are no findings suspicious for malignancy. IMPRESSION: No mammographic evidence of malignancy. A result letter of this screening mammogram will be mailed directly to the patient. RECOMMENDATION: Screening mammogram in one year. (Code:SM-B-01Y) BI-RADS CATEGORY  1: Negative. Electronically Signed   By: Elberta Fortis M.D.   On: 07/28/2023 13:24    Recent Labs: Lab Results  Component Value Date   WBC 6.3 03/01/2023   HGB 11.6 (L) 03/01/2023   PLT 263 03/01/2023   NA 140 03/01/2023   K 4.0 03/01/2023   CL 103  03/01/2023   CO2 30 03/01/2023   GLUCOSE 89 03/01/2023   BUN 13 03/01/2023   CREATININE 0.70 03/01/2023   BILITOT 0.5 03/01/2023   AST 17 03/01/2023   ALT 13 03/01/2023   PROT 7.1 03/01/2023   CALCIUM 10.0 03/01/2023   GFRAA >60 05/31/2016    Speciality Comments: PLQ Eye Exam scheduled 08/2023 per patient  Procedures:  No procedures performed Allergies: Patient has no known allergies.   Assessment / Plan:     Visit Diagnoses: Chronic inflammatory arthritis: Overall she has clinically been doing well taking Plaquenil 200 mg 1 tablet by mouth twice daily Monday through Friday.  She is tolerating Plaquenil without any side effects and has not had any recent gaps in therapy.  She has noticed an improvement in her mobility since initiating Plaquenil.  She has been using topical agents and a compression sleeve on her right knee for support.  She declined a right knee joint cortisone injection today but had mild warmth on exam.  No effusion noted.  She has been taking meloxicam 7.5 mg 1 tablet  daily for pain relief.  Overall she has noticed improvement while taking Plaquenil and does not want to make any medication changes at this time.  She was advised to notify us if she develops signs or symptoms of a flare.  She will follow-up in the office in 5 months or sooner if needed.  High risk medication use - Plaquenil added in march 2024---Plaquenil 200 mg 1 tablet by mouth twice daily Monday through Friday.  PLQ Eye Exam performed in February 2025.  Plan to call to obtain Plaquenil eye examination results. CBC and CMP updated on 03/01/23.  Orders for CBC and CMP released today.   - Plan: CBC with Differential/Platelet, COMPLETE METABOLIC PANEL WITH GFR  Primary osteoarthritis of both hands: Patient has severe PIP and DIP thickening consistent with osteoarthritis of both hands.  CMC joint prominence noted bilaterally.  She has some tenderness over the left [facility] joint on examination today.  No active synovitis noted.  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  Chronic pain, right >left.  Warmth but no effusion noted in the right knee.  Patient has been using topical agents and wearing a compression sleeve on her right knee for support.  She declined a right knee joint cortisone injection today.  She will notify us if her symptoms persist or worsen.  Primary osteoarthritis of both feet: She is not experiencing any increased discomfort in her feet at this time.  Other medical conditions are listed as follows:  Vitamin D deficiency  Essential hypertension: Blood pressure was 125/79 today in the office.  History of gastroesophageal reflux (GERD)  Nail dystrophy  OSA (obstructive sleep apnea)  History of hypothyroidism  Family history of systemic lupus erythematosus  Orders: Orders Placed This Encounter  Procedures  . CBC with Differential/Platelet  . COMPLETE  METABOLIC PANEL WITH GFR   Meds ordered this encounter  Medications  . hydroxychloroquine (PLAQUENIL) 200 MG tablet    Sig: TAKE 1 TABLET 200 MG TWICE A DAY MONDAY-FRIDAY    Dispense:  120 tablet    Refill:  0     Follow-Up Instructions: Return in about 5 months (around 01/17/2024) for Chronic inflammatory arthritis.   Gearldine Bienenstock, PA-C  Note - This record has been created using Dragon software.  Chart creation errors have been sought, but may  not always  have been located. Such creation errors do not reflect on  the standard of medical care.

## 2023-08-08 DIAGNOSIS — Z79899 Other long term (current) drug therapy: Secondary | ICD-10-CM | POA: Diagnosis not present

## 2023-08-17 ENCOUNTER — Ambulatory Visit: Payer: Self-pay | Attending: Physician Assistant | Admitting: Physician Assistant

## 2023-08-17 ENCOUNTER — Encounter: Payer: Self-pay | Admitting: Physician Assistant

## 2023-08-17 VITALS — BP 125/79 | HR 73 | Resp 14 | Ht 64.0 in | Wt 191.0 lb

## 2023-08-17 DIAGNOSIS — Z79899 Other long term (current) drug therapy: Secondary | ICD-10-CM

## 2023-08-17 DIAGNOSIS — I10 Essential (primary) hypertension: Secondary | ICD-10-CM | POA: Diagnosis not present

## 2023-08-17 DIAGNOSIS — M19072 Primary osteoarthritis, left ankle and foot: Secondary | ICD-10-CM

## 2023-08-17 DIAGNOSIS — Z8269 Family history of other diseases of the musculoskeletal system and connective tissue: Secondary | ICD-10-CM

## 2023-08-17 DIAGNOSIS — M1990 Unspecified osteoarthritis, unspecified site: Secondary | ICD-10-CM

## 2023-08-17 DIAGNOSIS — E559 Vitamin D deficiency, unspecified: Secondary | ICD-10-CM | POA: Diagnosis not present

## 2023-08-17 DIAGNOSIS — M19042 Primary osteoarthritis, left hand: Secondary | ICD-10-CM

## 2023-08-17 DIAGNOSIS — M170 Bilateral primary osteoarthritis of knee: Secondary | ICD-10-CM | POA: Diagnosis not present

## 2023-08-17 DIAGNOSIS — M19041 Primary osteoarthritis, right hand: Secondary | ICD-10-CM | POA: Diagnosis not present

## 2023-08-17 DIAGNOSIS — Z8719 Personal history of other diseases of the digestive system: Secondary | ICD-10-CM | POA: Diagnosis not present

## 2023-08-17 DIAGNOSIS — L603 Nail dystrophy: Secondary | ICD-10-CM | POA: Diagnosis not present

## 2023-08-17 DIAGNOSIS — M19071 Primary osteoarthritis, right ankle and foot: Secondary | ICD-10-CM | POA: Diagnosis not present

## 2023-08-17 DIAGNOSIS — G4733 Obstructive sleep apnea (adult) (pediatric): Secondary | ICD-10-CM

## 2023-08-17 DIAGNOSIS — Z8639 Personal history of other endocrine, nutritional and metabolic disease: Secondary | ICD-10-CM

## 2023-08-17 MED ORDER — HYDROXYCHLOROQUINE SULFATE 200 MG PO TABS: ORAL_TABLET | ORAL | 0 refills | Status: DC

## 2023-08-18 ENCOUNTER — Other Ambulatory Visit: Payer: Self-pay | Admitting: Internal Medicine

## 2023-08-18 DIAGNOSIS — M170 Bilateral primary osteoarthritis of knee: Secondary | ICD-10-CM | POA: Diagnosis not present

## 2023-08-18 DIAGNOSIS — M1711 Unilateral primary osteoarthritis, right knee: Secondary | ICD-10-CM | POA: Diagnosis not present

## 2023-08-18 DIAGNOSIS — E039 Hypothyroidism, unspecified: Secondary | ICD-10-CM | POA: Diagnosis not present

## 2023-08-18 DIAGNOSIS — E2839 Other primary ovarian failure: Secondary | ICD-10-CM

## 2023-08-18 DIAGNOSIS — E559 Vitamin D deficiency, unspecified: Secondary | ICD-10-CM | POA: Diagnosis not present

## 2023-08-18 DIAGNOSIS — Z7409 Other reduced mobility: Secondary | ICD-10-CM | POA: Diagnosis not present

## 2023-08-18 DIAGNOSIS — M1990 Unspecified osteoarthritis, unspecified site: Secondary | ICD-10-CM | POA: Diagnosis not present

## 2023-08-18 DIAGNOSIS — E789 Disorder of lipoprotein metabolism, unspecified: Secondary | ICD-10-CM | POA: Diagnosis not present

## 2023-08-18 DIAGNOSIS — E66811 Obesity, class 1: Secondary | ICD-10-CM | POA: Diagnosis not present

## 2023-08-18 DIAGNOSIS — Z0000 Encounter for general adult medical examination without abnormal findings: Secondary | ICD-10-CM | POA: Diagnosis not present

## 2023-08-18 DIAGNOSIS — I10 Essential (primary) hypertension: Secondary | ICD-10-CM | POA: Diagnosis not present

## 2023-08-18 LAB — CBC WITH DIFFERENTIAL/PLATELET
Absolute Lymphocytes: 2586 {cells}/uL (ref 850–3900)
Absolute Monocytes: 470 {cells}/uL (ref 200–950)
Basophils Absolute: 49 {cells}/uL (ref 0–200)
Basophils Relative: 0.8 %
Eosinophils Absolute: 128 {cells}/uL (ref 15–500)
Eosinophils Relative: 2.1 %
HCT: 37.9 % (ref 35.0–45.0)
Hemoglobin: 11.8 g/dL (ref 11.7–15.5)
MCH: 27.6 pg (ref 27.0–33.0)
MCHC: 31.1 g/dL — ABNORMAL LOW (ref 32.0–36.0)
MCV: 88.6 fL (ref 80.0–100.0)
MPV: 11.3 fL (ref 7.5–12.5)
Monocytes Relative: 7.7 %
Neutro Abs: 2867 {cells}/uL (ref 1500–7800)
Neutrophils Relative %: 47 %
Platelets: 288 10*3/uL (ref 140–400)
RBC: 4.28 10*6/uL (ref 3.80–5.10)
RDW: 13 % (ref 11.0–15.0)
Total Lymphocyte: 42.4 %
WBC: 6.1 10*3/uL (ref 3.8–10.8)

## 2023-08-18 LAB — COMPLETE METABOLIC PANEL WITH GFR
AG Ratio: 1.4 (calc) (ref 1.0–2.5)
ALT: 12 U/L (ref 6–29)
AST: 18 U/L (ref 10–35)
Albumin: 4.3 g/dL (ref 3.6–5.1)
Alkaline phosphatase (APISO): 93 U/L (ref 37–153)
BUN: 12 mg/dL (ref 7–25)
CO2: 32 mmol/L (ref 20–32)
Calcium: 10.2 mg/dL (ref 8.6–10.4)
Chloride: 105 mmol/L (ref 98–110)
Creat: 0.9 mg/dL (ref 0.60–1.00)
Globulin: 3 g/dL (ref 1.9–3.7)
Glucose, Bld: 84 mg/dL (ref 65–99)
Potassium: 3.8 mmol/L (ref 3.5–5.3)
Sodium: 142 mmol/L (ref 135–146)
Total Bilirubin: 0.5 mg/dL (ref 0.2–1.2)
Total Protein: 7.3 g/dL (ref 6.1–8.1)
eGFR: 69 mL/min/{1.73_m2} (ref >=60)

## 2023-08-18 NOTE — Progress Notes (Signed)
 CBC and CMP WNL

## 2023-10-26 DIAGNOSIS — H2513 Age-related nuclear cataract, bilateral: Secondary | ICD-10-CM | POA: Diagnosis not present

## 2023-10-26 DIAGNOSIS — H16223 Keratoconjunctivitis sicca, not specified as Sjogren's, bilateral: Secondary | ICD-10-CM | POA: Diagnosis not present

## 2023-10-26 DIAGNOSIS — H25013 Cortical age-related cataract, bilateral: Secondary | ICD-10-CM | POA: Diagnosis not present

## 2023-10-27 ENCOUNTER — Other Ambulatory Visit: Payer: Self-pay | Admitting: Physician Assistant

## 2023-10-27 NOTE — Telephone Encounter (Signed)
 Last Fill: 08/17/2023  Eye exam: not on file.   Labs: 08/17/2023 CBC and CMP WNL   Next Visit: 01/18/2024  Last Visit: 08/17/2023  DX: Chronic inflammatory arthritis   Current Dose per office note 08/17/2023: Plaquenil  200 mg 1 tablet by mouth twice daily Monday through Friday.   Patient advised she is due or her PLQ eye exam. Patient states she has had it completed and will contact the eye doctor to have it sent to our office.   Okay to refill Plaquenil ?

## 2023-11-08 ENCOUNTER — Other Ambulatory Visit: Payer: Self-pay | Admitting: Rheumatology

## 2023-11-09 ENCOUNTER — Other Ambulatory Visit: Payer: Self-pay | Admitting: Rheumatology

## 2024-01-04 NOTE — Progress Notes (Signed)
 Office Visit Note  Patient: Jennifer Shields             Date of Birth: 19-Aug-1950           MRN: [id number]             PCP: Elliot Charm, MD Referring: Elliot Charm,* Visit Date: 01/18/2024 Occupation: @GUAROCC @  Subjective:  Right knee pain  History of Present Illness: Jennifer Shields is a 73 y.o. female with chronic inflammatory arthritis and osteoarthritis.  She returns today after her last visit in March 2025.  She remains on hydroxychloroquine  200 mg twice daily Monday to Friday.  She continues to have some discomfort in her hands and her knee joints.  She notices some swelling in her knee joints.    Activities of Daily Living:  Patient reports morning stiffness for 5-10 minutes.   Patient Reports nocturnal pain.  Difficulty dressing/grooming: Denies Difficulty climbing stairs: Denies Difficulty getting out of chair: Reports Difficulty using hands for taps, buttons, cutlery, and/or writing: Reports  Review of Systems  Constitutional:  Positive for fatigue.  HENT:  Positive for mouth dryness. Negative for mouth sores.   Eyes:  Positive for dryness.  Respiratory:  Negative for shortness of breath.   Cardiovascular:  Negative for chest pain and palpitations.  Gastrointestinal:  Negative for blood in stool, constipation and diarrhea.  Endocrine: Negative for increased urination.  Genitourinary:  Negative for involuntary urination.  Musculoskeletal:  Positive for joint pain, gait problem, joint pain, joint swelling, myalgias, muscle weakness, morning stiffness and myalgias. Negative for muscle tenderness.  Skin:  Negative for color change, rash, hair loss and sensitivity to sunlight.  Allergic/Immunologic: Negative for susceptible to infections.  Neurological:  Positive for dizziness. Negative for headaches.  Hematological:  Negative for swollen glands.  Psychiatric/Behavioral:  Negative for depressed mood and sleep disturbance. The patient is not  nervous/anxious.     PMFS History:  Patient Active Problem List   Diagnosis Date Noted   Primary osteoarthritis of both hands 01/18/2024   Primary osteoarthritis of both knees 01/18/2024   Primary osteoarthritis of both feet 01/18/2024   Chronic inflammatory arthritis 01/18/2024   OSA (obstructive sleep apnea) 01/15/2021   History of hypothyroidism 01/15/2021   History of gastroesophageal reflux (GERD) 01/15/2021   Essential hypertension 01/15/2021   Vitamin D deficiency 01/15/2021    Past Medical History:  Diagnosis Date   Allergy    Arthritis    in hands,knees ,feet and legs   Hypertension    Sleep apnea    does not uses c-pap at this time   Thyroid  disease     Family History  Problem Relation Age of Onset   Diabetes Sister    Stroke Daughter    Hypertension Daughter    Lung disease Brother    Heart disease Brother    Hypertension Son    Breast cancer Neg Hx    BRCA 1/2 Neg Hx    Past Surgical History:  Procedure Laterality Date   ABDOMINAL HYSTERECTOMY     BREAST BIOPSY Left 04/28/2020   COLONOSCOPY     THYROIDECTOMY     Social History   Social History Narrative   Not on file   Immunization History  Administered Date(s) Administered   Moderna Sars-Covid-2 Vaccination 08/01/2019, 08/29/2019, 05/31/2020   Tdap 03/03/2016     Objective: Vital Signs: BP 108/65 (BP Location: Left Arm, Patient Position: Sitting, Cuff Size: Normal)   Pulse 70   Resp 14  Ht 5' 4 (1.626 m)   Wt 189 lb 12.8 oz (86.1 kg)   BMI 32.58 kg/m    Physical Exam Vitals and nursing note reviewed.  Constitutional:      Appearance: She is well-developed.  HENT:     Head: Normocephalic and atraumatic.  Eyes:     Conjunctiva/sclera: Conjunctivae normal.  Cardiovascular:     Rate and Rhythm: Normal rate and regular rhythm.     Heart sounds: Normal heart sounds.  Pulmonary:     Effort: Pulmonary effort is normal.     Breath sounds: Normal breath sounds.  Abdominal:      General: Bowel sounds are normal.     Palpations: Abdomen is soft.  Musculoskeletal:     Cervical back: Normal range of motion.  Lymphadenopathy:     Cervical: No cervical adenopathy.  Skin:    General: Skin is warm and dry.     Capillary Refill: Capillary refill takes less than 2 seconds.  Neurological:     Mental Status: She is alert and oriented to person, place, and time.  Psychiatric:        Behavior: Behavior normal.      Musculoskeletal Exam: She good range of motion of the cervical thoracic and lumbar spine.  Thoracic kyphosis was noted.  She had good range of motion of her shoulder joints with some stiffness.  Elbows were in good range of motion.  She had left wrist joint extensor tenosynovitis without tenderness.  Bilateral CMC, PIP and DIP thickening was noted.  Hip joints were in good range of motion.  She had discomfort with range of motion of her right knee joint.  There was no tenderness over her ankles or MTPs.  CDAI Exam: CDAI Score: -- Patient Global: --; Provider Global: -- Swollen: --; Tender: -- Joint Exam 01/18/2024   No joint exam has been documented for this visit   There is currently no information documented on the homunculus. Go to the Rheumatology activity and complete the homunculus joint exam.  Investigation: No additional findings.  Imaging: No results found.  Recent Labs: Lab Results  Component Value Date   WBC 6.1 08/17/2023   HGB 11.8 08/17/2023   PLT 288 08/17/2023   NA 142 08/17/2023   K 3.8 08/17/2023   CL 105 08/17/2023   CO2 32 08/17/2023   GLUCOSE 84 08/17/2023   BUN 12 08/17/2023   CREATININE 0.90 08/17/2023   BILITOT 0.5 08/17/2023   AST 18 08/17/2023   ALT 12 08/17/2023   PROT 7.3 08/17/2023   CALCIUM 10.2 08/17/2023   GFRAA >60 05/31/2016    Speciality Comments: PLQ Eye Exam scheduled 08/2023 per patient  Procedures:  No procedures performed Allergies: Patient has no known allergies.   Assessment / Plan:      Visit Diagnoses: Chronic inflammatory arthritis -she continues to have some discomfort in her hands and her knee joints.  Mild extensor tenosynovitis was noted in the left wrist joint.  She also has discomfort in the right knee joint.  No swelling was noted.  She is on Plaquenil  200 mg 1 tablet by mouth twice daily Monday through Friday.  He is tolerating the medication well.  She also takes meloxicam 7.5 mg p.o. daily.  High risk medication use - Plaquenil  added in march 2024---Plaquenil  200 mg 1 tablet by mouth twice daily Monday through Friday. PLQ Eye Exam performed in March 2025 - Plan: CBC with Differential/Platelet, Comprehensive metabolic panel with GFR today.  Information immunization placed  in the AVS.  Primary osteoarthritis of both hands-she has severe osteoarthritis and continues to have some stiffness.  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  She does not want to have a knee joint replacement.  She had cortisone injection in the past which was helpful.  Primary osteoarthritis of both feet-Ro fitting shoes were advised.  Vitamin D deficiency  Essential hypertension-blood pressure was normal today.  History of gastroesophageal reflux (GERD)  Nail dystrophy  OSA (obstructive sleep apnea)  History of hypothyroidism  Family history of systemic lupus erythematosus  Orders: Orders Placed This Encounter  Procedures   CBC with Differential/Platelet   Comprehensive metabolic panel with GFR   Meds ordered this encounter  Medications   hydroxychloroquine  (PLAQUENIL ) 200 MG tablet    Sig: TAKE 1 TABLET 200 MG TWICE A DAY MONDAY-FRIDAY    Dispense:  120 tablet    Refill:  0     Follow-Up Instructions: Return in about 5 months (around 06/19/2024) for Chronic inflammatory arthritis .   Maya Nash, MD  Note - This record has been created using [employer].  Chart creation errors have been sought, but may not always  have been located. Such creation errors do not reflect on  the standard of medical care.

## 2024-01-18 ENCOUNTER — Encounter: Payer: Self-pay | Admitting: Rheumatology

## 2024-01-18 ENCOUNTER — Ambulatory Visit: Attending: Rheumatology | Admitting: Rheumatology

## 2024-01-18 VITALS — BP 108/65 | HR 70 | Resp 14 | Ht 64.0 in | Wt 189.8 lb

## 2024-01-18 DIAGNOSIS — M19041 Primary osteoarthritis, right hand: Secondary | ICD-10-CM | POA: Insufficient documentation

## 2024-01-18 DIAGNOSIS — Z8639 Personal history of other endocrine, nutritional and metabolic disease: Secondary | ICD-10-CM

## 2024-01-18 DIAGNOSIS — E559 Vitamin D deficiency, unspecified: Secondary | ICD-10-CM

## 2024-01-18 DIAGNOSIS — M1990 Unspecified osteoarthritis, unspecified site: Secondary | ICD-10-CM | POA: Insufficient documentation

## 2024-01-18 DIAGNOSIS — G4733 Obstructive sleep apnea (adult) (pediatric): Secondary | ICD-10-CM

## 2024-01-18 DIAGNOSIS — M19071 Primary osteoarthritis, right ankle and foot: Secondary | ICD-10-CM | POA: Insufficient documentation

## 2024-01-18 DIAGNOSIS — Z8269 Family history of other diseases of the musculoskeletal system and connective tissue: Secondary | ICD-10-CM

## 2024-01-18 DIAGNOSIS — L603 Nail dystrophy: Secondary | ICD-10-CM | POA: Diagnosis not present

## 2024-01-18 DIAGNOSIS — M19042 Primary osteoarthritis, left hand: Secondary | ICD-10-CM

## 2024-01-18 DIAGNOSIS — Z79899 Other long term (current) drug therapy: Secondary | ICD-10-CM | POA: Diagnosis not present

## 2024-01-18 DIAGNOSIS — M170 Bilateral primary osteoarthritis of knee: Secondary | ICD-10-CM | POA: Insufficient documentation

## 2024-01-18 DIAGNOSIS — M19072 Primary osteoarthritis, left ankle and foot: Secondary | ICD-10-CM

## 2024-01-18 DIAGNOSIS — Z8719 Personal history of other diseases of the digestive system: Secondary | ICD-10-CM

## 2024-01-18 DIAGNOSIS — I10 Essential (primary) hypertension: Secondary | ICD-10-CM | POA: Diagnosis not present

## 2024-01-18 LAB — CBC WITH DIFFERENTIAL/PLATELET
Absolute Lymphocytes: 2747 {cells}/uL (ref 850–3900)
Absolute Monocytes: 476 {cells}/uL (ref 200–950)
Basophils Absolute: 41 {cells}/uL (ref 0–200)
Basophils Relative: 0.6 %
Eosinophils Absolute: 156 {cells}/uL (ref 15–500)
Eosinophils Relative: 2.3 %
HCT: 37.5 % (ref 35.0–45.0)
Hemoglobin: 11.9 g/dL (ref 11.7–15.5)
MCH: 28.5 pg (ref 27.0–33.0)
MCHC: 31.7 g/dL — ABNORMAL LOW (ref 32.0–36.0)
MCV: 89.7 fL (ref 80.0–100.0)
MPV: 10.8 fL (ref 7.5–12.5)
Monocytes Relative: 7 %
Neutro Abs: 3380 {cells}/uL (ref 1500–7800)
Neutrophils Relative %: 49.7 %
Platelets: 254 Thousand/uL (ref 140–400)
RBC: 4.18 Million/uL (ref 3.80–5.10)
RDW: 12.9 % (ref 11.0–15.0)
Total Lymphocyte: 40.4 %
WBC: 6.8 Thousand/uL (ref 3.8–10.8)

## 2024-01-18 LAB — COMPREHENSIVE METABOLIC PANEL WITH GFR
AG Ratio: 1.4 (calc) (ref 1.0–2.5)
ALT: 14 U/L (ref 6–29)
AST: 18 U/L (ref 10–35)
Albumin: 4.2 g/dL (ref 3.6–5.1)
Alkaline phosphatase (APISO): 92 U/L (ref 37–153)
BUN: 14 mg/dL (ref 7–25)
CO2: 29 mmol/L (ref 20–32)
Calcium: 9.8 mg/dL (ref 8.6–10.4)
Chloride: 103 mmol/L (ref 98–110)
Creat: 0.79 mg/dL (ref 0.60–1.00)
Globulin: 2.9 g/dL (ref 1.9–3.7)
Glucose, Bld: 105 mg/dL — ABNORMAL HIGH (ref 65–99)
Potassium: 3.7 mmol/L (ref 3.5–5.3)
Sodium: 140 mmol/L (ref 135–146)
Total Bilirubin: 0.4 mg/dL (ref 0.2–1.2)
Total Protein: 7.1 g/dL (ref 6.1–8.1)
eGFR: 80 mL/min/1.73m2 (ref >=60)

## 2024-01-18 MED ORDER — HYDROXYCHLOROQUINE SULFATE 200 MG PO TABS: ORAL_TABLET | ORAL | 0 refills | Status: AC

## 2024-01-18 NOTE — Patient Instructions (Signed)
 Vaccines You are taking a medication(s) that can suppress your immune system.  The following immunizations are recommended: Flu annually Covid-19  Td/Tdap (tetanus, diphtheria, pertussis) every 10 years Pneumonia (Prevnar 15 then Pneumovax 23 at least 1 year apart.  Alternatively, can take Prevnar 20 without needing additional dose) Shingrix: 2 doses from 4 weeks to 6 months apart  Please check with your PCP to make sure you are up to date.

## 2024-01-19 ENCOUNTER — Ambulatory Visit: Payer: Self-pay | Admitting: Rheumatology

## 2024-01-19 NOTE — Progress Notes (Signed)
 CBC and CMP are stable.

## 2024-02-26 DIAGNOSIS — M170 Bilateral primary osteoarthritis of knee: Secondary | ICD-10-CM | POA: Diagnosis not present

## 2024-02-26 DIAGNOSIS — E669 Obesity, unspecified: Secondary | ICD-10-CM | POA: Diagnosis not present

## 2024-02-26 DIAGNOSIS — Z79899 Other long term (current) drug therapy: Secondary | ICD-10-CM | POA: Diagnosis not present

## 2024-02-26 DIAGNOSIS — I10 Essential (primary) hypertension: Secondary | ICD-10-CM | POA: Diagnosis not present

## 2024-03-15 ENCOUNTER — Other Ambulatory Visit: Payer: Self-pay | Admitting: Rheumatology

## 2024-03-21 DIAGNOSIS — E785 Hyperlipidemia, unspecified: Secondary | ICD-10-CM | POA: Diagnosis not present

## 2024-03-21 DIAGNOSIS — E039 Hypothyroidism, unspecified: Secondary | ICD-10-CM | POA: Diagnosis not present

## 2024-03-21 DIAGNOSIS — I129 Hypertensive chronic kidney disease with stage 1 through stage 4 chronic kidney disease, or unspecified chronic kidney disease: Secondary | ICD-10-CM | POA: Diagnosis not present

## 2024-03-21 DIAGNOSIS — H269 Unspecified cataract: Secondary | ICD-10-CM | POA: Diagnosis not present

## 2024-03-21 DIAGNOSIS — M1990 Unspecified osteoarthritis, unspecified site: Secondary | ICD-10-CM | POA: Diagnosis not present

## 2024-03-21 DIAGNOSIS — Z6832 Body mass index (BMI) 32.0-32.9, adult: Secondary | ICD-10-CM | POA: Diagnosis not present

## 2024-03-21 DIAGNOSIS — M8580 Other specified disorders of bone density and structure, unspecified site: Secondary | ICD-10-CM | POA: Diagnosis not present

## 2024-03-21 DIAGNOSIS — J302 Other seasonal allergic rhinitis: Secondary | ICD-10-CM | POA: Diagnosis not present

## 2024-03-21 DIAGNOSIS — Z7952 Long term (current) use of systemic steroids: Secondary | ICD-10-CM | POA: Diagnosis not present

## 2024-03-21 DIAGNOSIS — E669 Obesity, unspecified: Secondary | ICD-10-CM | POA: Diagnosis not present

## 2024-03-21 DIAGNOSIS — Z79621 Long term (current) use of calcineurin inhibitor: Secondary | ICD-10-CM | POA: Diagnosis not present

## 2024-03-21 DIAGNOSIS — N182 Chronic kidney disease, stage 2 (mild): Secondary | ICD-10-CM | POA: Diagnosis not present

## 2024-03-21 DIAGNOSIS — Z79890 Hormone replacement therapy (postmenopausal): Secondary | ICD-10-CM | POA: Diagnosis not present

## 2024-03-21 DIAGNOSIS — M3500 Sicca syndrome, unspecified: Secondary | ICD-10-CM | POA: Diagnosis not present

## 2024-04-25 ENCOUNTER — Other Ambulatory Visit

## 2024-04-25 ENCOUNTER — Encounter (HOSPITAL_BASED_OUTPATIENT_CLINIC_OR_DEPARTMENT_OTHER): Payer: Self-pay

## 2024-04-25 ENCOUNTER — Other Ambulatory Visit (HOSPITAL_BASED_OUTPATIENT_CLINIC_OR_DEPARTMENT_OTHER)

## 2024-06-06 NOTE — Progress Notes (Deleted)
 "  Office Visit Note  Patient: Jennifer Shields             Date of Birth: 08/02/1950           MRN: [id number]             PCP: Elliot Charm, MD Referring: Elliot Charm, MD Visit Date: 06/19/2024 Occupation: CNA  Subjective:    History of Present Illness: Jennifer Shields is a 73 y.o. female with history of chronic inflammatory arthritis and osteoarthritis.  Patient remains on Plaquenil  200 mg 1 tablet by mouth twice daily Monday through Friday.   CBC and CMP updated on 01/18/24. Orders for CBC and CMP released today.   No baseline plaquenil  eye exam on file.     Activities of Daily Living:  Patient reports morning stiffness for *** {minute/hour:19697}.   Patient {ACTIONS;DENIES/REPORTS:21021675::Denies} nocturnal pain.  Difficulty dressing/grooming: {ACTIONS;DENIES/REPORTS:21021675::Denies} Difficulty climbing stairs: {ACTIONS;DENIES/REPORTS:21021675::Denies} Difficulty getting out of chair: {ACTIONS;DENIES/REPORTS:21021675::Denies} Difficulty using hands for taps, buttons, cutlery, and/or writing: {ACTIONS;DENIES/REPORTS:21021675::Denies}  No Rheumatology ROS completed.   PMFS History:  Patient Active Problem List   Diagnosis Date Noted   Primary osteoarthritis of both hands 01/18/2024   Primary osteoarthritis of both knees 01/18/2024   Primary osteoarthritis of both feet 01/18/2024   Chronic inflammatory arthritis 01/18/2024   OSA (obstructive sleep apnea) 01/15/2021   History of hypothyroidism 01/15/2021   History of gastroesophageal reflux (GERD) 01/15/2021   Essential hypertension 01/15/2021   Vitamin D deficiency 01/15/2021    Past Medical History:  Diagnosis Date   Allergy    Arthritis    in hands,knees ,feet and legs   Hypertension    Sleep apnea    does not uses c-pap at this time   Thyroid  disease     Family History  Problem Relation Age of Onset   Diabetes Sister    Stroke Daughter    Hypertension Daughter    Lung  disease Brother    Heart disease Brother    Hypertension Son    Breast cancer Neg Hx    BRCA 1/2 Neg Hx    Past Surgical History:  Procedure Laterality Date   ABDOMINAL HYSTERECTOMY     BREAST BIOPSY Left 04/28/2020   COLONOSCOPY     THYROIDECTOMY     Social History[1] Social History   Social History Narrative   Not on file     Immunization History  Administered Date(s) Administered   Moderna Sars-Covid-2 Vaccination 08/01/2019, 08/29/2019, 05/31/2020   Tdap 03/03/2016     Objective: Vital Signs: There were no vitals taken for this visit.   Physical Exam Vitals and nursing note reviewed.  Constitutional:      Appearance: She is well-developed.  HENT:     Head: Normocephalic and atraumatic.  Eyes:     Conjunctiva/sclera: Conjunctivae normal.  Cardiovascular:     Rate and Rhythm: Normal rate and regular rhythm.     Heart sounds: Normal heart sounds.  Pulmonary:     Effort: Pulmonary effort is normal.     Breath sounds: Normal breath sounds.  Abdominal:     General: Bowel sounds are normal.     Palpations: Abdomen is soft.  Musculoskeletal:     Cervical back: Normal range of motion.  Lymphadenopathy:     Cervical: No cervical adenopathy.  Skin:    General: Skin is warm and dry.     Capillary Refill: Capillary refill takes less than 2 seconds.  Neurological:     Mental Status: She  is alert and oriented to person, place, and time.  Psychiatric:        Behavior: Behavior normal.      Musculoskeletal Exam: ***  CDAI Exam: CDAI Score: -- Patient Global: --; Provider Global: -- Swollen: --; Tender: -- Joint Exam 06/19/2024   No joint exam has been documented for this visit   There is currently no information documented on the homunculus. Go to the Rheumatology activity and complete the homunculus joint exam.  Investigation: No additional findings.  Imaging: No results found.  Recent Labs: Lab Results  Component Value Date   WBC 6.8 01/18/2024    HGB 11.9 01/18/2024   PLT 254 01/18/2024   NA 140 01/18/2024   K 3.7 01/18/2024   CL 103 01/18/2024   CO2 29 01/18/2024   GLUCOSE 105 (H) 01/18/2024   BUN 14 01/18/2024   CREATININE 0.79 01/18/2024   BILITOT 0.4 01/18/2024   AST 18 01/18/2024   ALT 14 01/18/2024   PROT 7.1 01/18/2024   CALCIUM 9.8 01/18/2024   GFRAA >60 05/31/2016    Speciality Comments: PLQ Eye Exam scheduled 08/2023 per patient  Procedures:  No procedures performed Allergies: Patient has no known allergies.   Assessment / Plan:     Visit Diagnoses: Chronic inflammatory arthritis  High risk medication use  Primary osteoarthritis of both hands  Primary osteoarthritis of both knees  Primary osteoarthritis of both feet  Vitamin D deficiency  Essential hypertension  History of gastroesophageal reflux (GERD)  Nail dystrophy  OSA (obstructive sleep apnea)  History of hypothyroidism  Family history of systemic lupus erythematosus  Orders: No orders of the defined types were placed in this encounter.  No orders of the defined types were placed in this encounter.   Face-to-face time spent with patient was *** minutes. Greater than 50% of time was spent in counseling and coordination of care.  Follow-Up Instructions: No follow-ups on file.   Waddell CHRISTELLA Craze, PA-C  Note - This record has been created using Dragon software.  Chart creation errors have been sought, but may not always  have been located. Such creation errors do not reflect on  the standard of medical care.     [1]  Social History Tobacco Use   Smoking status: Never    Passive exposure: Never   Smokeless tobacco: Never  Vaping Use   Vaping status: Never Used  Substance Use Topics   Alcohol use: No    Alcohol/week: 0.0 standard drinks of alcohol   Drug use: No   "

## 2024-06-19 ENCOUNTER — Ambulatory Visit: Admitting: Physician Assistant

## 2024-06-19 DIAGNOSIS — Z8639 Personal history of other endocrine, nutritional and metabolic disease: Secondary | ICD-10-CM

## 2024-06-19 DIAGNOSIS — E559 Vitamin D deficiency, unspecified: Secondary | ICD-10-CM

## 2024-06-19 DIAGNOSIS — I10 Essential (primary) hypertension: Secondary | ICD-10-CM

## 2024-06-19 DIAGNOSIS — G4733 Obstructive sleep apnea (adult) (pediatric): Secondary | ICD-10-CM

## 2024-06-19 DIAGNOSIS — M170 Bilateral primary osteoarthritis of knee: Secondary | ICD-10-CM

## 2024-06-19 DIAGNOSIS — Z8719 Personal history of other diseases of the digestive system: Secondary | ICD-10-CM

## 2024-06-19 DIAGNOSIS — M1380 Other specified arthritis, unspecified site: Secondary | ICD-10-CM

## 2024-06-19 DIAGNOSIS — M19041 Primary osteoarthritis, right hand: Secondary | ICD-10-CM

## 2024-06-19 DIAGNOSIS — Z79899 Other long term (current) drug therapy: Secondary | ICD-10-CM

## 2024-06-19 DIAGNOSIS — M19071 Primary osteoarthritis, right ankle and foot: Secondary | ICD-10-CM

## 2024-06-19 DIAGNOSIS — Z8269 Family history of other diseases of the musculoskeletal system and connective tissue: Secondary | ICD-10-CM

## 2024-06-19 DIAGNOSIS — L603 Nail dystrophy: Secondary | ICD-10-CM

## 2024-06-24 ENCOUNTER — Other Ambulatory Visit: Payer: Self-pay | Admitting: Family Medicine

## 2024-06-24 DIAGNOSIS — Z1231 Encounter for screening mammogram for malignant neoplasm of breast: Secondary | ICD-10-CM

## 2024-07-26 ENCOUNTER — Ambulatory Visit

## 2024-08-07 ENCOUNTER — Ambulatory Visit: Admitting: Physician Assistant
